# Patient Record
Sex: Male | Born: 1965 | Race: White | Hispanic: No | Marital: Married | Smoking: Never smoker
Health system: Southern US, Community
[De-identification: ages and names within clinical notes are randomized; demographics above are authoritative.]

## PROBLEM LIST (undated history)

## (undated) DIAGNOSIS — G4733 Obstructive sleep apnea (adult) (pediatric): Secondary | ICD-10-CM

## (undated) DIAGNOSIS — T7840XA Allergy, unspecified, initial encounter: Secondary | ICD-10-CM

## (undated) DIAGNOSIS — G4730 Sleep apnea, unspecified: Secondary | ICD-10-CM

## (undated) DIAGNOSIS — K219 Gastro-esophageal reflux disease without esophagitis: Secondary | ICD-10-CM

## (undated) DIAGNOSIS — E785 Hyperlipidemia, unspecified: Secondary | ICD-10-CM

## (undated) DIAGNOSIS — E66813 Obesity, class 3: Secondary | ICD-10-CM

## (undated) DIAGNOSIS — E039 Hypothyroidism, unspecified: Secondary | ICD-10-CM

## (undated) DIAGNOSIS — I10 Essential (primary) hypertension: Secondary | ICD-10-CM

## (undated) DIAGNOSIS — F329 Major depressive disorder, single episode, unspecified: Secondary | ICD-10-CM

## (undated) DIAGNOSIS — K5792 Diverticulitis of intestine, part unspecified, without perforation or abscess without bleeding: Secondary | ICD-10-CM

## (undated) DIAGNOSIS — F32A Depression, unspecified: Secondary | ICD-10-CM

## (undated) HISTORY — DX: Diverticulitis of intestine, part unspecified, without perforation or abscess without bleeding: K57.92

## (undated) HISTORY — DX: Allergy, unspecified, initial encounter: T78.40XA

## (undated) HISTORY — DX: Hyperlipidemia, unspecified: E78.5

## (undated) HISTORY — DX: Depression, unspecified: F32.A

## (undated) HISTORY — PX: POLYPECTOMY: SHX149

## (undated) HISTORY — DX: Hypothyroidism, unspecified: E03.9

## (undated) HISTORY — DX: Essential (primary) hypertension: I10

## (undated) HISTORY — DX: Major depressive disorder, single episode, unspecified: F32.9

## (undated) HISTORY — DX: Gastro-esophageal reflux disease without esophagitis: K21.9

## (undated) HISTORY — DX: Obstructive sleep apnea (adult) (pediatric): G47.33

## (undated) HISTORY — PX: COLONOSCOPY: SHX174

## (undated) HISTORY — DX: Sleep apnea, unspecified: G47.30

---

## 1997-09-07 ENCOUNTER — Encounter: Admission: RE | Admit: 1997-09-07 | Discharge: 1997-09-07 | Payer: Self-pay | Admitting: *Deleted

## 2000-01-22 HISTORY — PX: NASAL SEPTUM SURGERY: SHX37

## 2001-01-21 HISTORY — PX: UMBILICAL HERNIA REPAIR: SHX196

## 2003-11-30 ENCOUNTER — Ambulatory Visit: Payer: Self-pay | Admitting: Pulmonary Disease

## 2003-12-26 ENCOUNTER — Ambulatory Visit: Payer: Self-pay | Admitting: Pulmonary Disease

## 2003-12-26 ENCOUNTER — Ambulatory Visit (HOSPITAL_BASED_OUTPATIENT_CLINIC_OR_DEPARTMENT_OTHER): Admission: RE | Admit: 2003-12-26 | Discharge: 2003-12-26 | Payer: Self-pay | Admitting: Pulmonary Disease

## 2004-02-14 ENCOUNTER — Ambulatory Visit: Payer: Self-pay | Admitting: Pulmonary Disease

## 2004-03-13 ENCOUNTER — Ambulatory Visit: Payer: Self-pay | Admitting: Pulmonary Disease

## 2007-11-23 ENCOUNTER — Ambulatory Visit: Payer: Self-pay | Admitting: Pulmonary Disease

## 2007-11-23 DIAGNOSIS — E785 Hyperlipidemia, unspecified: Secondary | ICD-10-CM | POA: Insufficient documentation

## 2007-11-23 DIAGNOSIS — G4733 Obstructive sleep apnea (adult) (pediatric): Secondary | ICD-10-CM

## 2008-11-21 ENCOUNTER — Ambulatory Visit: Payer: Self-pay | Admitting: Pulmonary Disease

## 2009-05-03 ENCOUNTER — Ambulatory Visit: Payer: Self-pay | Admitting: Internal Medicine

## 2009-05-04 ENCOUNTER — Encounter: Payer: Self-pay | Admitting: Internal Medicine

## 2009-05-04 ENCOUNTER — Inpatient Hospital Stay (HOSPITAL_COMMUNITY): Admission: EM | Admit: 2009-05-04 | Discharge: 2009-05-06 | Payer: Self-pay | Admitting: Emergency Medicine

## 2009-05-04 ENCOUNTER — Encounter (INDEPENDENT_AMBULATORY_CARE_PROVIDER_SITE_OTHER): Payer: Self-pay

## 2009-11-20 ENCOUNTER — Ambulatory Visit: Payer: Self-pay | Admitting: Pulmonary Disease

## 2010-01-21 HISTORY — PX: CHOLECYSTECTOMY: SHX55

## 2010-02-20 NOTE — Assessment & Plan Note (Signed)
Summary: rov for osa   Visit Type:  Follow-up Primary Tashia Leiterman/Referring Jannat Rosemeyer:  Avva  CC:  1 year follow up. pt states uses cpap everynight x 7 hrs a night. Pt states he is having no problems at all. Marland Kitchen  History of Present Illness: the pt comes in today for f/u of his known osa.  He is wearing  cpap compliantly, and is satisfied with his sleep and daytime alertness.  He denies any issue with mask fit, but is due for a new mask.  His pressure is adequate, and he feels his machine is functioning properly.  His wife has not c/o breakthru snoring.  Current Medications (verified): 1)  Vytorin 10-40 Mg Tabs (Ezetimibe-Simvastatin) .... Take 1 Tablet By Mouth Once A Day 2)  Levoxyl 137 Mcg Tabs (Levothyroxine Sodium) .... Take 1 Tablet By Mouth Once A Day 3)  Sertraline Hcl 50 Mg Tabs (Sertraline Hcl) .... Take 1 Tablet By Mouth Once A Day  Allergies (verified): No Known Drug Allergies  Review of Systems       The patient complains of nasal congestion/difficulty breathing through nose.  The patient denies shortness of breath with activity, shortness of breath at rest, productive cough, non-productive cough, coughing up blood, chest pain, irregular heartbeats, acid heartburn, indigestion, loss of appetite, weight change, abdominal pain, difficulty swallowing, sore throat, tooth/dental problems, headaches, sneezing, itching, ear ache, anxiety, depression, hand/feet swelling, joint stiffness or pain, rash, change in color of mucus, and fever.    Vital Signs:  Patient profile:   45 year old male Height:      71 inches Weight:      271 pounds BMI:     37.93 O2 Sat:      98 % on Room air Temp:     98.4 degrees F oral Pulse rate:   60 / minute BP sitting:   110 / 72  (left arm) Cuff size:   large  Vitals Entered By: Carver Fila (November 20, 2009 9:04 AM)  O2 Flow:  Room air CC: 1 year follow up. pt states uses cpap everynight x 7 hrs a night. Pt states he is having no problems at all.    Comments meds and allergies updated Phone number updated Carver Fila  November 20, 2009 9:05 AM    Physical Exam  General:  ow male in nad Nose:  no skin breakdown or pressure necrosis from cpap mask Extremities:  no edema or cyanosis noted. Neurologic:  alert and oriented, moves all 4.   Impression & Recommendations:  Problem # 1:  OBSTRUCTIVE SLEEP APNEA (ICD-327.23)  the pt is doing well with cpap, and feels that his sleep and daytime alertness are adequate.  He is due for a replacement mask and supplies, and he will contact dme for this.  I have encouraged him to work on weight loss, and to follow up with me in one year.    Other Orders: Est. Patient Level III (98119)  Patient Instructions: 1)  work on weight loss 2)  call dme about getting new mask and supplies 3)  if doing well, followup with me in one year.   Immunization History:  Influenza Immunization History:    Influenza:  historical (08/21/2009)

## 2010-04-10 LAB — COMPREHENSIVE METABOLIC PANEL
ALT: 106 U/L — ABNORMAL HIGH (ref 0–53)
ALT: 66 U/L — ABNORMAL HIGH (ref 0–53)
AST: 31 U/L (ref 0–37)
AST: 72 U/L — ABNORMAL HIGH (ref 0–37)
Alkaline Phosphatase: 84 U/L (ref 39–117)
Alkaline Phosphatase: 94 U/L (ref 39–117)
BUN: 6 mg/dL (ref 6–23)
BUN: 7 mg/dL (ref 6–23)
CO2: 27 mEq/L (ref 19–32)
Calcium: 8.1 mg/dL — ABNORMAL LOW (ref 8.4–10.5)
Calcium: 8.2 mg/dL — ABNORMAL LOW (ref 8.4–10.5)
Chloride: 106 mEq/L (ref 96–112)
Creatinine, Ser: 1.14 mg/dL (ref 0.4–1.5)
Creatinine, Ser: 1.17 mg/dL (ref 0.4–1.5)
GFR calc non Af Amer: >60 mL/min (ref >60)
Glucose, Bld: 102 mg/dL — ABNORMAL HIGH (ref 70–99)
Glucose, Bld: 86 mg/dL (ref 70–99)
Potassium: 3.2 mEq/L — ABNORMAL LOW (ref 3.5–5.1)
Sodium: 136 mEq/L (ref 135–145)
Total Protein: 6.1 g/dL (ref 6.0–8.3)

## 2010-04-10 LAB — CBC
HCT: 34.7 % — ABNORMAL LOW (ref 39.0–52.0)
MCHC: 34.6 g/dL (ref 30.0–36.0)
MCHC: 34.6 g/dL (ref 30.0–36.0)
Platelets: 203 10*3/uL (ref 150–400)
RBC: 3.78 MIL/uL — ABNORMAL LOW (ref 4.22–5.81)
RDW: 13.3 % (ref 11.5–15.5)
WBC: 8.6 10*3/uL (ref 4.0–10.5)

## 2010-04-11 LAB — COMPREHENSIVE METABOLIC PANEL
ALT: 22 U/L (ref 0–53)
ALT: 38 U/L (ref 0–53)
AST: 22 U/L (ref 0–37)
Albumin: 4.3 g/dL (ref 3.5–5.2)
Alkaline Phosphatase: 83 U/L (ref 39–117)
Alkaline Phosphatase: 93 U/L (ref 39–117)
BUN: 9 mg/dL (ref 6–23)
CO2: 26 mEq/L (ref 19–32)
Calcium: 9.2 mg/dL (ref 8.4–10.5)
Chloride: 106 mEq/L (ref 96–112)
GFR calc non Af Amer: >60 mL/min (ref >60)
GFR calc non Af Amer: >60 mL/min (ref >60)
Glucose, Bld: 136 mg/dL — ABNORMAL HIGH (ref 70–99)
Glucose, Bld: 156 mg/dL — ABNORMAL HIGH (ref 70–99)
Potassium: 3.4 mEq/L — ABNORMAL LOW (ref 3.5–5.1)
Potassium: 3.9 mEq/L (ref 3.5–5.1)
Sodium: 131 mEq/L — ABNORMAL LOW (ref 135–145)
Total Bilirubin: 0.4 mg/dL (ref 0.3–1.2)
Total Protein: 6.9 g/dL (ref 6.0–8.3)
Total Protein: 7.6 g/dL (ref 6.0–8.3)

## 2010-04-11 LAB — URINALYSIS, ROUTINE W REFLEX MICROSCOPIC
Bilirubin Urine: NEGATIVE
Glucose, UA: NEGATIVE mg/dL
Ketones, ur: NEGATIVE mg/dL
Leukocytes, UA: NEGATIVE
Nitrite: NEGATIVE
Protein, ur: NEGATIVE mg/dL
Urobilinogen, UA: 0.2 mg/dL (ref 0.0–1.0)

## 2010-04-11 LAB — CULTURE, BLOOD (ROUTINE X 2)
Culture: NO GROWTH
Culture: NO GROWTH

## 2010-04-11 LAB — CBC
HCT: 39.8 % (ref 39.0–52.0)
HCT: 40.9 % (ref 39.0–52.0)
Hemoglobin: 13.6 g/dL (ref 13.0–17.0)
MCHC: 34.1 g/dL (ref 30.0–36.0)
RBC: 4.36 MIL/uL (ref 4.22–5.81)
RDW: 13.3 % (ref 11.5–15.5)
RDW: 13.7 % (ref 11.5–15.5)
WBC: 11.5 10*3/uL — ABNORMAL HIGH (ref 4.0–10.5)

## 2010-04-11 LAB — TROPONIN I: Troponin I: <0.01 ng/mL (ref 0.00–0.06)

## 2010-04-11 LAB — LIPASE, BLOOD: Lipase: 23 U/L (ref 11–59)

## 2010-04-11 LAB — URINE MICROSCOPIC-ADD ON

## 2010-04-11 LAB — DIFFERENTIAL
Basophils Absolute: 0 10*3/uL (ref 0.0–0.1)
Lymphocytes Relative: 11 % — ABNORMAL LOW (ref 12–46)
Lymphs Abs: 1.2 10*3/uL (ref 0.7–4.0)
Monocytes Relative: 4 % (ref 3–12)

## 2010-04-11 LAB — CARDIAC PANEL(CRET KIN+CKTOT+MB+TROPI)
CK, MB: 0.6 ng/mL (ref 0.3–4.0)
Total CK: 68 U/L (ref 7–232)

## 2010-04-11 LAB — CK TOTAL AND CKMB (NOT AT ARMC)
CK, MB: 1.2 ng/mL (ref 0.3–4.0)
Relative Index: INVALID (ref 0.0–2.5)

## 2010-04-11 LAB — TSH: TSH: 1.227 u[IU]/mL (ref 0.350–4.500)

## 2010-04-11 LAB — POCT CARDIAC MARKERS
Myoglobin, poc: 47.1 ng/mL (ref 12–200)
Troponin i, poc: <0.05 ng/mL (ref 0.00–0.09)

## 2010-04-11 LAB — MRSA PCR SCREENING: MRSA by PCR: NEGATIVE

## 2010-06-08 NOTE — Procedures (Signed)
Joshua Mcdonald, WITTY NO.:  [id number]   MEDICAL RECORD NO.:  [id number]          PATIENT TYPE:  OUT   LOCATION:  SLEEP CENTER                 FACILITY:  [facility]   PHYSICIAN:  Marcelyn Bruins, M.D. [facility] DATE OF BIRTH:  1965-08-26   DATE OF STUDY:  12/26/2003                              NOCTURNAL POLYSOMNOGRAM   REFERRING PHYSICIAN:  Dr. Marcelyn Bruins.   INDICATION FOR THE STUDY:  Hypersomnia with sleep apnea.  Epworth score is  8.   SLEEP ARCHITECTURE:  The patient had a total sleep time of 370 minutes with  a sleep efficiency of 90%.  There was decreased slow wave sleep and REM.  Sleep latency and REM latency were normal.   IMPRESSION:  1.  Severe obstructive sleep apnea/hypopnea syndrome with a respiratory      disturbance index of 45 events per hour and oxygen desaturation as low      as 68%.  Events were not positional.  2.  Very loud snoring noted throughout the study.  3.  Significant cardiac accelerations and decelerations associated with his      obstructive events.      KC/MEDQ  D:  01/06/2004 11:22:03  T:  01/07/2004 09:12:47  Job:  [id number]

## 2010-11-16 ENCOUNTER — Encounter: Payer: Self-pay | Admitting: Pulmonary Disease

## 2010-11-19 ENCOUNTER — Ambulatory Visit: Payer: Self-pay | Admitting: Pulmonary Disease

## 2012-10-26 ENCOUNTER — Ambulatory Visit (INDEPENDENT_AMBULATORY_CARE_PROVIDER_SITE_OTHER): Payer: BC Managed Care – PPO | Admitting: Pulmonary Disease

## 2012-10-26 ENCOUNTER — Encounter: Payer: Self-pay | Admitting: Pulmonary Disease

## 2012-10-26 VITALS — BP 138/90 | HR 68 | Temp 98.0°F | Ht 70.0 in | Wt 265.6 lb

## 2012-10-26 DIAGNOSIS — G4733 Obstructive sleep apnea (adult) (pediatric): Secondary | ICD-10-CM

## 2012-10-26 DIAGNOSIS — Z23 Encounter for immunization: Secondary | ICD-10-CM

## 2012-10-26 NOTE — Progress Notes (Signed)
  Subjective:    Patient ID: Joshua Mcdonald, male    DOB: July 12, 1965, 47 y.o.   MRN: [id number]  HPI Patient comes in today for followup of his obstructive sleep apnea.  I have not seen him since 2011, but he is been using his CPAP religiously.  He feels that he sleeps fairly well with this device, but he is starting to hear unusual noises and whining coming from his machine.  He has severe regional device from 2005, and it is clearly overdue for replacement.  The patient is satisfied with his daytime alertness.  Of note, his weight is down 6 pounds from the last visit with me.   Review of Systems  Constitutional: Negative for fever and unexpected weight change.  HENT: Negative for ear pain, nosebleeds, congestion, sore throat, rhinorrhea, sneezing, trouble swallowing, dental problem, postnasal drip and sinus pressure.   Eyes: Negative for redness and itching.  Respiratory: Negative for cough, chest tightness, shortness of breath and wheezing.   Cardiovascular: Negative for palpitations and leg swelling.  Gastrointestinal: Negative for nausea and vomiting.  Genitourinary: Negative for dysuria.  Musculoskeletal: Negative for joint swelling.  Skin: Negative for rash.  Neurological: Negative for headaches.  Hematological: Does not bruise/bleed easily.  Psychiatric/Behavioral: Negative for dysphoric mood. The patient is not nervous/anxious.        Objective:   Physical Exam Overweight male in no acute distress Nose without purulent discharge noted No skin breakdown or pressure necrosis from the CPAP mask Neck without lymphadenopathy or thyromegaly Lower extremities without edema, cyanosis Alert and oriented, moves all 4 extremities.       Assessment & Plan:

## 2012-10-26 NOTE — Patient Instructions (Addendum)
Continue with cpap, and we will send an order to get you a new device. Work on weight loss followup with me in one year if doing well.

## 2012-10-26 NOTE — Assessment & Plan Note (Signed)
The patient is doing fairly well with CPAP, but is overdue for a replacement device.  I will arrange for him to get a new machine, and I've asked him to keep up with his supplies and mask changes.  I have also encouraged him to work aggressively on weight loss.

## 2013-10-26 ENCOUNTER — Ambulatory Visit (INDEPENDENT_AMBULATORY_CARE_PROVIDER_SITE_OTHER): Payer: BC Managed Care – PPO | Admitting: Pulmonary Disease

## 2013-10-26 ENCOUNTER — Encounter: Payer: Self-pay | Admitting: Pulmonary Disease

## 2013-10-26 VITALS — BP 142/78 | HR 66 | Temp 98.1°F | Ht 70.0 in | Wt 288.8 lb

## 2013-10-26 DIAGNOSIS — G4733 Obstructive sleep apnea (adult) (pediatric): Secondary | ICD-10-CM

## 2013-10-26 NOTE — Progress Notes (Signed)
   Subjective:    Patient ID: Joshua Mcdonald, male    DOB: [birth year], 48 y.o.   MRN: [id number]  HPI Patient comes in today for followup of his obstructive sleep apnea.  He is wearing CPAP compliantly, and is having no issues with his mask fit or pressure. His download shows excellent compliance, and no significant mask leak. He has good control of his AHI. The patient feels that he is sleeping reasonably well other than issues with his young child at night. It should be noted he has gained over 20 pounds since the last visit.   Review of Systems  Constitutional: Negative for fever and unexpected weight change.  HENT: Negative for congestion, dental problem, ear pain, nosebleeds, postnasal drip, rhinorrhea, sinus pressure, sneezing, sore throat and trouble swallowing.   Eyes: Negative for redness and itching.  Respiratory: Negative for cough, chest tightness, shortness of breath and wheezing.   Cardiovascular: Negative for palpitations and leg swelling.  Gastrointestinal: Negative for nausea and vomiting.  Genitourinary: Negative for dysuria.  Musculoskeletal: Negative for joint swelling.  Skin: Negative for rash.  Neurological: Negative for headaches.  Hematological: Does not bruise/bleed easily.  Psychiatric/Behavioral: Negative for dysphoric mood. The patient is not nervous/anxious.        Objective:   Physical Exam Obese male in no acute distress Nose without purulence or discharge noted, no skin breakdown or pressure necrosis from the CPAP mask Neck without lymphadenopathy or thyromegaly Lower extremities without edema, no cyanosis Alert and oriented, moves all 4 extremities.       Assessment & Plan:

## 2013-10-26 NOTE — Patient Instructions (Signed)
Continue on cpap, and keep up with mask changes and supplies. Work on weight loss followup with me again in one year.  

## 2013-10-26 NOTE — Assessment & Plan Note (Signed)
The patient is doing very well on CPAP, and his download shows good control of his apnea with excellent compliance. I have asked him to continue on this, keep up with mask changes and supplies, and to work aggressively on weight loss.

## 2014-05-13 ENCOUNTER — Inpatient Hospital Stay (HOSPITAL_COMMUNITY)
Admission: EM | Admit: 2014-05-13 | Discharge: 2014-05-16 | DRG: 392 | Disposition: A | Discharge status: Home / Self Care | Payer: BLUE CROSS/BLUE SHIELD | Attending: Surgery | Admitting: Surgery

## 2014-05-13 ENCOUNTER — Emergency Department (HOSPITAL_COMMUNITY): Payer: BLUE CROSS/BLUE SHIELD

## 2014-05-13 ENCOUNTER — Encounter (HOSPITAL_COMMUNITY): Payer: Self-pay | Admitting: Physical Medicine and Rehabilitation

## 2014-05-13 DIAGNOSIS — E669 Obesity, unspecified: Secondary | ICD-10-CM | POA: Diagnosis present

## 2014-05-13 DIAGNOSIS — G4733 Obstructive sleep apnea (adult) (pediatric): Secondary | ICD-10-CM | POA: Diagnosis present

## 2014-05-13 DIAGNOSIS — Z6841 Body Mass Index (BMI) 40.0 and over, adult: Secondary | ICD-10-CM

## 2014-05-13 DIAGNOSIS — E785 Hyperlipidemia, unspecified: Secondary | ICD-10-CM | POA: Diagnosis present

## 2014-05-13 DIAGNOSIS — K5780 Diverticulitis of intestine, part unspecified, with perforation and abscess without bleeding: Secondary | ICD-10-CM | POA: Diagnosis present

## 2014-05-13 DIAGNOSIS — K5720 Diverticulitis of large intestine with perforation and abscess without bleeding: Principal | ICD-10-CM | POA: Diagnosis present

## 2014-05-13 HISTORY — DX: Obesity, class 3: E66.813

## 2014-05-13 HISTORY — DX: Morbid (severe) obesity due to excess calories: E66.01

## 2014-05-13 LAB — COMPREHENSIVE METABOLIC PANEL
ALBUMIN: 3.5 g/dL (ref 3.5–5.2)
ALK PHOS: 128 U/L — AB (ref 39–117)
ALT: 38 U/L (ref 0–53)
ANION GAP: 11 (ref 5–15)
AST: 28 U/L (ref 0–37)
BILIRUBIN TOTAL: 1 mg/dL (ref 0.3–1.2)
BUN: 10 mg/dL (ref 6–23)
CO2: 25 mmol/L (ref 19–32)
Calcium: 9 mg/dL (ref 8.4–10.5)
Chloride: 103 mmol/L (ref 96–112)
Creatinine, Ser: 1.15 mg/dL (ref 0.50–1.35)
GFR calc Af Amer: 84 mL/min — ABNORMAL LOW (ref >90)
GFR calc non Af Amer: 73 mL/min — ABNORMAL LOW (ref >90)
Glucose, Bld: 105 mg/dL — ABNORMAL HIGH (ref 70–99)
Potassium: 3.5 mmol/L (ref 3.5–5.1)
SODIUM: 139 mmol/L (ref 135–145)
TOTAL PROTEIN: 7.5 g/dL (ref 6.0–8.3)

## 2014-05-13 LAB — URINALYSIS, ROUTINE W REFLEX MICROSCOPIC
Glucose, UA: NEGATIVE mg/dL
KETONES UR: 15 mg/dL — AB
Leukocytes, UA: NEGATIVE
Nitrite: NEGATIVE
PROTEIN: 100 mg/dL — AB
SPECIFIC GRAVITY, URINE: 1.028 (ref 1.005–1.030)
Urobilinogen, UA: 1 mg/dL (ref 0.0–1.0)
pH: 5.5 (ref 5.0–8.0)

## 2014-05-13 LAB — CBC WITH DIFFERENTIAL/PLATELET
Basophils Absolute: 0 10*3/uL (ref 0.0–0.1)
Basophils Relative: 0 % (ref 0–1)
EOS PCT: 0 % (ref 0–5)
Eosinophils Absolute: 0 10*3/uL (ref 0.0–0.7)
HEMATOCRIT: 42.2 % (ref 39.0–52.0)
Hemoglobin: 14.1 g/dL (ref 13.0–17.0)
LYMPHS ABS: 1.4 10*3/uL (ref 0.7–4.0)
LYMPHS PCT: 10 % — AB (ref 12–46)
MCH: 29.6 pg (ref 26.0–34.0)
MCHC: 33.4 g/dL (ref 30.0–36.0)
MCV: 88.5 fL (ref 78.0–100.0)
MONO ABS: 1.2 10*3/uL — AB (ref 0.1–1.0)
Monocytes Relative: 9 % (ref 3–12)
NEUTROS ABS: 10.9 10*3/uL — AB (ref 1.7–7.7)
Neutrophils Relative %: 81 % — ABNORMAL HIGH (ref 43–77)
PLATELETS: 291 10*3/uL (ref 150–400)
RBC: 4.77 MIL/uL (ref 4.22–5.81)
RDW: 14.1 % (ref 11.5–15.5)
WBC: 13.5 10*3/uL — ABNORMAL HIGH (ref 4.0–10.5)

## 2014-05-13 LAB — URINE MICROSCOPIC-ADD ON

## 2014-05-13 LAB — I-STAT CREATININE, ED: CREATININE: 1.1 mg/dL (ref 0.50–1.35)

## 2014-05-13 LAB — I-STAT CG4 LACTIC ACID, ED: LACTIC ACID, VENOUS: 0.79 mmol/L (ref 0.5–2.0)

## 2014-05-13 LAB — LIPASE, BLOOD: Lipase: 19 U/L (ref 11–59)

## 2014-05-13 MED ORDER — METRONIDAZOLE IN NACL 5-0.79 MG/ML-% IV SOLN
500.0000 mg | Freq: Once | INTRAVENOUS | Status: AC
  Administered 2014-05-13: 500 mg via INTRAVENOUS
  Filled 2014-05-13: qty 100

## 2014-05-13 MED ORDER — ONDANSETRON HCL 4 MG/2ML IJ SOLN: 4.0000 mg | Freq: Once | INTRAMUSCULAR | Status: DC

## 2014-05-13 MED ORDER — IOHEXOL 300 MG/ML  SOLN
100.0000 mL | Freq: Once | INTRAMUSCULAR | Status: AC | PRN
  Administered 2014-05-13: 100 mL via INTRAVENOUS

## 2014-05-13 MED ORDER — HYDROMORPHONE HCL 1 MG/ML IJ SOLN
1.0000 mg | INTRAMUSCULAR | Status: DC | PRN
Start: 2014-05-13 — End: 2014-05-15

## 2014-05-13 MED ORDER — PANTOPRAZOLE SODIUM 40 MG IV SOLR
40.0000 mg | Freq: Every day | INTRAVENOUS | Status: DC
  Administered 2014-05-13 – 2014-05-14 (×2): 40 mg via INTRAVENOUS
  Filled 2014-05-13 (×2): qty 40

## 2014-05-13 MED ORDER — PIPERACILLIN-TAZOBACTAM 3.375 G IVPB
3.3750 g | Freq: Three times a day (TID) | INTRAVENOUS | Status: DC
  Administered 2014-05-14 – 2014-05-15 (×4): 3.375 g via INTRAVENOUS
  Filled 2014-05-13 (×5): qty 50

## 2014-05-13 MED ORDER — MORPHINE SULFATE 4 MG/ML IJ SOLN
6.0000 mg | Freq: Once | INTRAMUSCULAR | Status: AC
  Administered 2014-05-13: 6 mg via INTRAVENOUS
  Filled 2014-05-13: qty 2

## 2014-05-13 MED ORDER — ACETAMINOPHEN 500 MG PO TABS
1000.0000 mg | ORAL_TABLET | Freq: Once | ORAL | Status: AC
  Administered 2014-05-13: 1000 mg via ORAL
  Filled 2014-05-13: qty 2

## 2014-05-13 MED ORDER — POTASSIUM CHLORIDE IN NACL 20-0.9 MEQ/L-% IV SOLN
INTRAVENOUS | Status: DC
  Administered 2014-05-13 – 2014-05-15 (×2): via INTRAVENOUS
  Filled 2014-05-13 (×5): qty 1000

## 2014-05-13 MED ORDER — SODIUM CHLORIDE 0.9 % IV BOLUS (SEPSIS)
1000.0000 mL | Freq: Once | INTRAVENOUS | Status: AC
  Administered 2014-05-13: 1000 mL via INTRAVENOUS

## 2014-05-13 MED ORDER — FENTANYL CITRATE (PF) 100 MCG/2ML IJ SOLN
50.0000 ug | Freq: Once | INTRAMUSCULAR | Status: AC
  Administered 2014-05-13: 50 ug via INTRAVENOUS
  Filled 2014-05-13: qty 2

## 2014-05-13 MED ORDER — ENOXAPARIN SODIUM 60 MG/0.6ML ~~LOC~~ SOLN
60.0000 mg | SUBCUTANEOUS | Status: DC
  Administered 2014-05-14 – 2014-05-15 (×2): 60 mg via SUBCUTANEOUS
  Filled 2014-05-13 (×3): qty 0.6

## 2014-05-13 MED ORDER — ONDANSETRON HCL 4 MG/2ML IJ SOLN
4.0000 mg | Freq: Four times a day (QID) | INTRAMUSCULAR | Status: DC | PRN
Start: 2014-05-13 — End: 2014-05-16

## 2014-05-13 MED ORDER — CIPROFLOXACIN IN D5W 400 MG/200ML IV SOLN
400.0000 mg | Freq: Once | INTRAVENOUS | Status: AC
  Administered 2014-05-13: 400 mg via INTRAVENOUS
  Filled 2014-05-13: qty 200

## 2014-05-13 MED ORDER — PIPERACILLIN-TAZOBACTAM 3.375 G IVPB 30 MIN
3.3750 g | Freq: Once | INTRAVENOUS | Status: AC
  Administered 2014-05-13: 3.375 g via INTRAVENOUS
  Filled 2014-05-13: qty 50

## 2014-05-13 MED ORDER — IOHEXOL 300 MG/ML  SOLN
25.0000 mL | Freq: Once | INTRAMUSCULAR | Status: AC | PRN
  Administered 2014-05-13: 25 mL via ORAL

## 2014-05-13 NOTE — Progress Notes (Signed)
Pt admitted from ED with c/o of abdominal pain,pt is pain free at this time,settled in bed, call light within pt's reach, will however continue to monitor. Obasogie-Asidi, Marka Treloar Efe

## 2014-05-13 NOTE — ED Notes (Signed)
Dr. Blackman at bedside. 

## 2014-05-13 NOTE — ED Provider Notes (Signed)
CSN: [id number]     Arrival date & time 05/13/14  1247 History   First MD Initiated Contact with Patient 05/13/14 1404     Chief Complaint  Patient presents with  . Abdominal Pain  . Fever  . Headache     (Consider location/radiation/quality/duration/timing/severity/associated sxs/prior Treatment) HPI   49 year old male with history of hyperlipidemia and history of obstructive sleep apnea who was sent here from primary care office for evaluation of abdominal pain. Patient report right lower quadrant abdominal tenderness ongoing for the past 3 days. He described pain as a crampy sensation, 8 out of 10, persistent, nothing seems to make it better or worse. Endorse a fever of 101.5 morning,  chills, sweats, myalgias. He also reported having 7-8 separate episodes of mucousy stools for the past several days. Endorse having headache. He has decreased appetite. He has tried taking ibuprofen with minimal relief. Patient denies cold symptoms, chest pain, shortness of breath, productive cough.  He was seen at his PCP office today for his complaint. He was found to have elevated WBC and was sent here for further evaluation of his abdominal pain. Patient also has a umbilical hernia from prior abdominal surgery but denies any significant pain at the hernia site. Prior surgical history of cholecystectomy but still has an intact appendix. Furthermore patient denies any recent antibiotic use, or recent travel. Denies any sick contact. His pain now as a 6 out of 10.    Past Medical History  Diagnosis Date  . OSA (obstructive sleep apnea)   . Hyperlipidemia    History reviewed. No pertinent past surgical history. Family History  Problem Relation Age of Onset  . Heart disease Maternal Grandmother   . Cancer Maternal Grandmother     unsure what type  . Lung cancer Maternal Grandfather   . Breast cancer Paternal Grandmother   . Liver cancer Paternal Grandfather    History  Substance Use Topics  .  Smoking status: Never Smoker   . Smokeless tobacco: Not on file  . Alcohol Use: No    Review of Systems  All other systems reviewed and are negative.     Allergies  Review of patient's allergies indicates no known allergies.  Home Medications   Prior to Admission medications   Medication Sig Start Date End Date Taking? Authorizing Provider  ezetimibe-simvastatin (VYTORIN) 10-40 MG per tablet Take 1 tablet by mouth at bedtime.      Historical Provider, MD  levothyroxine (SYNTHROID, LEVOTHROID) 175 MCG tablet Take 175 mcg by mouth daily before breakfast.    Historical Provider, MD  sertraline (ZOLOFT) 50 MG tablet Take 50 mg by mouth daily.      Historical Provider, MD   BP 151/85 mmHg  Pulse 96  Temp(Src) 99.2 F (37.3 C) (Oral)  Resp 18  SpO2 98% Physical Exam  Constitutional: He appears well-developed and well-nourished. No distress.  HENT:  Head: Atraumatic.  Eyes: Conjunctivae are normal.  Neck: Normal range of motion. Neck supple.  Cardiovascular: Normal rate and regular rhythm.   Pulmonary/Chest: Effort normal and breath sounds normal.  Abdominal: Soft. There is tenderness (Diffuse abdominal tenderness most significant to right lower quadrant with guarding but without rebound tenderness. Umbilical surgical scar without any obvious evidence of incarcerated hernia.). There is guarding. There is no rebound.  Decreased bowel sounds.  Genitourinary:  Chaperone present during exam: Normal penile shaft, circumcised. Left testicle mildly tender to palpation without any obvious mass or scrotal swelling. Testicle with normal lie. Testicle is  nontender to palpation. No inguinal hernia noted.  Neurological: He is alert.  Skin: No rash noted.  Psychiatric: He has a normal mood and affect.  Nursing note and vitals reviewed.   ED Course  Procedures (including critical care time)  Patient here with fever, chills, abdominal pain, decrease in appetite, and having diarrhea with  mucousy stool. Plan to obtain abdominal pelvic CT scan to rule out appendicitis or colitis causing his symptoms. Pain medication given. Will closely monitor.  4:13 PM Care discussed with oncoming provider who will determine disposition pending CT scan.  Pt currently resting comfortably and mentioned that his pain is well controlled.    Labs Review Labs Reviewed  CBC WITH DIFFERENTIAL/PLATELET - Abnormal; Notable for the following:    WBC 13.5 (*)    Neutrophils Relative % 81 (*)    Neutro Abs 10.9 (*)    Lymphocytes Relative 10 (*)    Monocytes Absolute 1.2 (*)    All other components within normal limits  COMPREHENSIVE METABOLIC PANEL - Abnormal; Notable for the following:    Glucose, Bld 105 (*)    Alkaline Phosphatase 128 (*)    GFR calc non Af Amer 73 (*)    GFR calc Af Amer 84 (*)    All other components within normal limits  LIPASE, BLOOD  URINALYSIS, ROUTINE W REFLEX MICROSCOPIC  I-STAT CREATININE, ED    Imaging Review No results found.   EKG Interpretation None      MDM   Final diagnoses:  None    BP 128/63 mmHg  Pulse 99  Temp(Src) 99.2 F (37.3 C) (Oral)  Resp 18  SpO2 99%     Domenic Moras, PA-C 05/13/14 [place], MD 05/14/14 [phone]

## 2014-05-13 NOTE — ED Notes (Signed)
Pt presents to department for evaluation of RLQ, fever, nausea and headache. 8/10 RLQ abdominal pain. Elevated WBC from PCP office. Pt is alert and oriented x4.

## 2014-05-13 NOTE — ED Provider Notes (Signed)
I received this patient in sign out from Domenic Moras, [place].  Briefly, this is a 49 year old male, with a history of hyperlipidemia, OSA, presenting today from primary care physician's office for evaluation of abdominal pain. Onset 3 days ago, located lower abdomen, worse in the right lower quadrant. Persistent, severe, crampy. Associated with fever, chills, diarrhea. Positive for decreased appetite.  Abdominal exam is without peritoneal signs.  Labs reveal leukocytosis. Negative for other acute abnormalities. Plan for now is to follow-up CT scan of the abdomen, rule out surgical etiology of his presentation. The patient remained stable, CT scan is within normal limits, we'll likely discharge.  CT scan reveals sigmoid colonic diverticulitis, with correlating 3 cm abscess. Antibiotics have been administered. Patient is nothing by mouth. He remains stable. Surgery has been consult. They will admit to their service. Patient is being transported in stable condition.  I have discussed case and care has been guided by my attending physician, Dr. Vanita Panda.  Joshua Hutching, MD 05/14/14 [id number]  Joshua Muskrat, MD 05/15/14 [phone]

## 2014-05-13 NOTE — ED Notes (Signed)
Pt back from CT at this time. Hooked back up to monitor.

## 2014-05-13 NOTE — H&P (Signed)
Joshua Mcdonald is an 49 y.o. male.   Chief Complaint: Abdominal pain HPI: This gentleman presents with a 2 day history of lower abdominal pain. He reports that it came on suddenly Wednesday night. He describes it as a sharp cramping pain. It moves to the right lower quadrant. He has had mucousy bowel movements. He has had nausea but no vomiting. He denies fevers. He has no previous history of similar abdominal pain. He is otherwise without complaints.  Past Medical History  Diagnosis Date  . OSA (obstructive sleep apnea)   . Hyperlipidemia     History reviewed. No pertinent past surgical history.  Family History  Problem Relation Age of Onset  . Heart disease Maternal Grandmother   . Cancer Maternal Grandmother     unsure what type  . Lung cancer Maternal Grandfather   . Breast cancer Paternal Grandmother   . Liver cancer Paternal Grandfather    Social History:  reports that he has never smoked. He does not have any smokeless tobacco history on file. He reports that he does not drink alcohol or use illicit drugs.  Allergies: No Known Allergies   (Not in a hospital admission)  Results for orders placed or performed during the hospital encounter of 05/13/14 (from the past 48 hour(s))  CBC with Differential     Status: Abnormal   Collection Time: 05/13/14  1:34 PM  Result Value Ref Range   WBC 13.5 (H) 4.0 - 10.5 K/uL   RBC 4.77 4.22 - 5.81 MIL/uL   Hemoglobin 14.1 13.0 - 17.0 g/dL   HCT 42.2 39.0 - 52.0 %   MCV 88.5 78.0 - 100.0 fL   MCH 29.6 26.0 - 34.0 pg   MCHC 33.4 30.0 - 36.0 g/dL   RDW 14.1 11.5 - 15.5 %   Platelets 291 150 - 400 K/uL   Neutrophils Relative % 81 (H) 43 - 77 %   Neutro Abs 10.9 (H) 1.7 - 7.7 K/uL   Lymphocytes Relative 10 (L) 12 - 46 %   Lymphs Abs 1.4 0.7 - 4.0 K/uL   Monocytes Relative 9 3 - 12 %   Monocytes Absolute 1.2 (H) 0.1 - 1.0 K/uL   Eosinophils Relative 0 0 - 5 %   Eosinophils Absolute 0.0 0.0 - 0.7 K/uL   Basophils Relative 0 0 - 1 %    Basophils Absolute 0.0 0.0 - 0.1 K/uL  Comprehensive metabolic panel     Status: Abnormal   Collection Time: 05/13/14  1:34 PM  Result Value Ref Range   Sodium 139 135 - 145 mmol/L   Potassium 3.5 3.5 - 5.1 mmol/L   Chloride 103 96 - 112 mmol/L   CO2 25 19 - 32 mmol/L   Glucose, Bld 105 (H) 70 - 99 mg/dL   BUN 10 6 - 23 mg/dL   Creatinine, Ser 1.15 0.50 - 1.35 mg/dL   Calcium 9.0 8.4 - 10.5 mg/dL   Total Protein 7.5 6.0 - 8.3 g/dL   Albumin 3.5 3.5 - 5.2 g/dL   AST 28 0 - 37 U/L   ALT 38 0 - 53 U/L   Alkaline Phosphatase 128 (H) 39 - 117 U/L   Total Bilirubin 1.0 0.3 - 1.2 mg/dL   GFR calc non Af Amer 73 (L) >90 mL/min   GFR calc Af Amer 84 (L) >90 mL/min    Comment: (NOTE) The eGFR has been calculated using the CKD EPI equation. This calculation has not been validated in all clinical situations.  eGFR's persistently <90 mL/min signify possible Chronic Kidney Disease.    Anion gap 11 5 - 15  Lipase, blood     Status: None   Collection Time: 05/13/14  1:34 PM  Result Value Ref Range   Lipase 19 11 - 59 U/L  I-Stat Creatinine, ED (do not order at [facility])     Status: None   Collection Time: 05/13/14  2:45 PM  Result Value Ref Range   Creatinine, Ser 1.10 0.50 - 1.35 mg/dL  Urinalysis, Routine w reflex microscopic     Status: Abnormal   Collection Time: 05/13/14  3:20 PM  Result Value Ref Range   Color, Urine ORANGE (A) YELLOW    Comment: BIOCHEMICALS MAY BE AFFECTED BY COLOR   APPearance CLEAR CLEAR   Specific Gravity, Urine 1.028 1.005 - 1.030   pH 5.5 5.0 - 8.0   Glucose, UA NEGATIVE NEGATIVE mg/dL   Hgb urine dipstick MODERATE (A) NEGATIVE   Bilirubin Urine SMALL (A) NEGATIVE   Ketones, ur 15 (A) NEGATIVE mg/dL   Protein, ur 100 (A) NEGATIVE mg/dL   Urobilinogen, UA 1.0 0.0 - 1.0 mg/dL   Nitrite NEGATIVE NEGATIVE   Leukocytes, UA NEGATIVE NEGATIVE  Urine microscopic-add on     Status: Abnormal   Collection Time: 05/13/14  3:20 PM  Result Value Ref Range   Squamous  Epithelial / LPF RARE RARE   WBC, UA 0-2 <3 WBC/hpf   RBC / HPF 0-2 <3 RBC/hpf   Bacteria, UA FEW (A) RARE   Urine-Other MUCOUS PRESENT   I-Stat CG4 Lactic Acid, ED     Status: None   Collection Time: 05/13/14  7:02 PM  Result Value Ref Range   Lactic Acid, Venous 0.79 0.5 - 2.0 mmol/L   Ct Abdomen Pelvis W Contrast  05/13/2014   CLINICAL DATA:  Acute onset of right lower quadrant abdominal pain (8/10), fever, nausea, leukocytosis and headache. Surgical history includes cholecystectomy.  EXAM: CT ABDOMEN AND PELVIS WITH CONTRAST  TECHNIQUE: Multidetector CT imaging of the abdomen and pelvis was performed using the standard protocol following bolus administration of intravenous contrast.  CONTRAST:  173mL OMNIPAQUE IOHEXOL 300 MG/ML IV. Oral contrast was also administered.  COMPARISON:  05/04/2009.  FINDINGS: Liver: Mild diffuse hepatic steatosis without focal parenchymal abnormality.  Biliary: Surgically absent gallbladder. No unexpected biliary ductal dilation.  Spleen:  Normal in size and appearance.  Pancreas:  Normal in appearance.  No pancreatic ductal dilation.  Adrenal glands:  Normal in appearance.  Genitourinary: Cortical cysts involving both kidneys, the largest arising from the lower pole of the left kidney measuring approximately 5.6 x 5.0 cm, unchanged. No significant abnormality involving either kidney. No hydronephrosis. Urinary bladder decompressed and unremarkable.  Borderline median lobe prostate gland enlargement. Normal seminal vesicles.  Gastrointestinal: Normal-appearing decompressed stomach. Normal-appearing small bowel. Wall thickening involving a long segment of the mid and distal sigmoid colon with associated edema/inflammation in the adjacent fat. Several extraluminal gas bubbles adjacent to the mid descending colon associated with a small extraluminal fluid collection measuring approximately 2.5 cm. Normal appendix in the right mid abdomen as the cecum is positioned in the  right upper quadrant.  Vascular: Mild distal abdominal aortic atherosclerosis. Widely patent visceral arteries. Visualized venous structures widely patent.  Lymphatic:  No pathologic lymphadenopathy in the abdomen or pelvis.  Ascites: Absent.  Musculoskeletal: Lower thoracic spondylosis. Mild facet degenerative changes involving the lower lumbar spine.  Other findings: Midline anterior abdominal wall hernia just above the umbilicus with  the defect approximating 1.7 cm, new since the prior examination, likely related to the the prior laparoscopic portal.  Visualized lower thorax: Heart size normal. Pleuroparenchymal scarring involving both lung bases.  IMPRESSION: 1. Acute diverticulitis involving the mid to distal sigmoid colon with an approximate 2.5 cm abscess adjacent to the mid sigmoid colon and associated extraluminal gas. 2. Midline anterior abdominal wall hernia just above the umbilicus with the defect measuring approximately 1.7 cm, likely at the site of the prior laparoscopy portal. 3. Stable approximate 5-6 cm simple cyst arising from the lower pole of the left kidney. 4. Mild diffuse hepatic steatosis without focal hepatic parenchymal abnormality.   Electronically Signed   By: Evangeline Dakin M.D.   On: 05/13/2014 17:50    Review of Systems  All other systems reviewed and are negative.   Blood pressure 129/81, pulse 85, temperature 98.7 F (37.1 C), temperature source Oral, resp. rate 16, SpO2 99 %. Physical Exam  Constitutional: He is oriented to person, place, and time. He appears well-developed and well-nourished. No distress.  Obese  HENT:  Head: Normocephalic and atraumatic.  Right Ear: External ear normal.  Left Ear: External ear normal.  Nose: Nose normal.  Mouth/Throat: Oropharynx is clear and moist. No oropharyngeal exudate.  Eyes: Conjunctivae are normal. Pupils are equal, round, and reactive to light. No scleral icterus.  Neck: Normal range of motion. No tracheal deviation  present.  Cardiovascular: Normal rate, regular rhythm, normal heart sounds and intact distal pulses.   No murmur heard. Respiratory: Effort normal and breath sounds normal. No respiratory distress. He has no wheezes.  GI: Soft. He exhibits no distension. There is tenderness. There is guarding.  There is mild tenderness with guarding in the mid low abdomen to the right. There is a well-healed incision above the umbilicus with a hernia containing incarcerated omentum  Musculoskeletal: Normal range of motion. He exhibits no edema or tenderness.  Lymphadenopathy:    He has no cervical adenopathy.  Neurological: He is alert and oriented to person, place, and time.  Skin: Skin is warm and dry. No rash noted. No erythema.  Psychiatric: His behavior is normal. Judgment normal.     Assessment/Plan Diverticulitis with abscess  I discussed the diagnosis with the patient and his wife. This is a small abscess and does not appear that he can be drained by interventional radiology. He does not need an emergent exploratory laparotomy. Plan will be to admit him to the hospital for IV antibiotics and bowel rest if he does not improve over the next several days, he may need a repeat CAT scan to see if the abscess is larger. If it still cannot be drained or he acutely worsens, he may require an exploratory laparotomy with resection and colostomy. He understands and agrees with the admission.Marland Kitchen    Madicyn Mesina A 05/13/2014, 9:35 PM

## 2014-05-14 ENCOUNTER — Encounter (HOSPITAL_COMMUNITY): Payer: Self-pay | Admitting: General Surgery

## 2014-05-14 LAB — BASIC METABOLIC PANEL
Anion gap: 11 (ref 5–15)
BUN: 10 mg/dL (ref 6–23)
CO2: 23 mmol/L (ref 19–32)
Calcium: 8.4 mg/dL (ref 8.4–10.5)
Chloride: 106 mmol/L (ref 96–112)
Creatinine, Ser: 1.07 mg/dL (ref 0.50–1.35)
GFR calc Af Amer: >90 mL/min (ref >90)
GFR calc non Af Amer: 79 mL/min — ABNORMAL LOW (ref >90)
Glucose, Bld: 94 mg/dL (ref 70–99)
Potassium: 3.6 mmol/L (ref 3.5–5.1)
SODIUM: 140 mmol/L (ref 135–145)

## 2014-05-14 LAB — CBC
HEMATOCRIT: 38.4 % — AB (ref 39.0–52.0)
Hemoglobin: 12.9 g/dL — ABNORMAL LOW (ref 13.0–17.0)
MCH: 29.6 pg (ref 26.0–34.0)
MCHC: 33.6 g/dL (ref 30.0–36.0)
MCV: 88.1 fL (ref 78.0–100.0)
PLATELETS: 265 10*3/uL (ref 150–400)
RBC: 4.36 MIL/uL (ref 4.22–5.81)
RDW: 13.9 % (ref 11.5–15.5)
WBC: 10.5 10*3/uL (ref 4.0–10.5)

## 2014-05-14 NOTE — Progress Notes (Signed)
[facility] Progress Note     Subjective: Pt feels good, pain nearly resolved, thirsty.  Has been ambulating well.  No N/V.  Having loose BM's.  Wife at bedside.    Objective: Vital signs in last 24 hours: Temp:  [98.7 F (37.1 C)-100.2 F (37.9 C)] 98.7 F (37.1 C) (04/23 0536) Pulse Rate:  [76-100] 77 (04/23 0536) Resp:  [16-18] 16 (04/23 0536) BP: (110-151)/(60-96) 124/68 mmHg (04/23 0536) SpO2:  [92 %-100 %] 100 % (04/23 0536) Weight:  [126.724 kg (279 lb 6 oz)] 126.724 kg (279 lb 6 oz) (04/22 2317) Last BM Date: 05/11/14  Intake/Output from previous day: 04/22 0701 - 04/23 0700 In: 1300 [I.V.:1300] Out: -  Intake/Output this shift:    PE: Gen:  Alert, NAD, pleasant Abd: Soft, ND, minimally tender in the LLQ, +BS, no HSM   Lab Results:   Recent Labs  05/13/14 1334 05/14/14 0336  WBC 13.5* 10.5  HGB 14.1 12.9*  HCT 42.2 38.4*  PLT 291 265   BMET  Recent Labs  05/13/14 1334 05/13/14 1445 05/14/14 0336  NA 139  --  140  K 3.5  --  3.6  CL 103  --  106  CO2 25  --  23  GLUCOSE 105*  --  94  BUN 10  --  10  CREATININE 1.15 1.10 1.07  CALCIUM 9.0  --  8.4   PT/INR No results for input(s): LABPROT, INR in the last 72 hours. CMP     Component Value Date/Time   NA 140 05/14/2014 0336   K 3.6 05/14/2014 0336   CL 106 05/14/2014 0336   CO2 23 05/14/2014 0336   GLUCOSE 94 05/14/2014 0336   BUN 10 05/14/2014 0336   CREATININE 1.07 05/14/2014 0336   CALCIUM 8.4 05/14/2014 0336   PROT 7.5 05/13/2014 1334   ALBUMIN 3.5 05/13/2014 1334   AST 28 05/13/2014 1334   ALT 38 05/13/2014 1334   ALKPHOS 128* 05/13/2014 1334   BILITOT 1.0 05/13/2014 1334   GFRNONAA 79* 05/14/2014 0336   GFRAA >90 05/14/2014 0336   Lipase     Component Value Date/Time   LIPASE 19 05/13/2014 1334       Studies/Results: Ct Abdomen Pelvis W Contrast  05/13/2014   CLINICAL DATA:  Acute onset of right lower quadrant abdominal pain (8/10), fever, nausea,  leukocytosis and headache. Surgical history includes cholecystectomy.  EXAM: CT ABDOMEN AND PELVIS WITH CONTRAST  TECHNIQUE: Multidetector CT imaging of the abdomen and pelvis was performed using the standard protocol following bolus administration of intravenous contrast.  CONTRAST:  152mL OMNIPAQUE IOHEXOL 300 MG/ML IV. Oral contrast was also administered.  COMPARISON:  05/04/2009.  FINDINGS: Liver: Mild diffuse hepatic steatosis without focal parenchymal abnormality.  Biliary: Surgically absent gallbladder. No unexpected biliary ductal dilation.  Spleen:  Normal in size and appearance.  Pancreas:  Normal in appearance.  No pancreatic ductal dilation.  Adrenal glands:  Normal in appearance.  Genitourinary: Cortical cysts involving both kidneys, the largest arising from the lower pole of the left kidney measuring approximately 5.6 x 5.0 cm, unchanged. No significant abnormality involving either kidney. No hydronephrosis. Urinary bladder decompressed and unremarkable.  Borderline median lobe prostate gland enlargement. Normal seminal vesicles.  Gastrointestinal: Normal-appearing decompressed stomach. Normal-appearing small bowel. Wall thickening involving a long segment of the mid and distal sigmoid colon with associated edema/inflammation in the adjacent fat. Several extraluminal gas bubbles adjacent to the mid descending colon associated with a small extraluminal fluid  collection measuring approximately 2.5 cm. Normal appendix in the right mid abdomen as the cecum is positioned in the right upper quadrant.  Vascular: Mild distal abdominal aortic atherosclerosis. Widely patent visceral arteries. Visualized venous structures widely patent.  Lymphatic:  No pathologic lymphadenopathy in the abdomen or pelvis.  Ascites: Absent.  Musculoskeletal: Lower thoracic spondylosis. Mild facet degenerative changes involving the lower lumbar spine.  Other findings: Midline anterior abdominal wall hernia just above the umbilicus  with the defect approximating 1.7 cm, new since the prior examination, likely related to the the prior laparoscopic portal.  Visualized lower thorax: Heart size normal. Pleuroparenchymal scarring involving both lung bases.  IMPRESSION: 1. Acute diverticulitis involving the mid to distal sigmoid colon with an approximate 2.5 cm abscess adjacent to the mid sigmoid colon and associated extraluminal gas. 2. Midline anterior abdominal wall hernia just above the umbilicus with the defect measuring approximately 1.7 cm, likely at the site of the prior laparoscopy portal. 3. Stable approximate 5-6 cm simple cyst arising from the lower pole of the left kidney. 4. Mild diffuse hepatic steatosis without focal hepatic parenchymal abnormality.   Electronically Signed   By: Evangeline Dakin M.D.   On: 05/13/2014 17:50    Anti-infectives: Anti-infectives    Start     Dose/Rate Route Frequency Ordered Stop   05/13/14 2330  piperacillin-tazobactam (ZOSYN) IVPB 3.375 g     3.375 g 100 mL/hr over 30 Minutes Intravenous  Once 05/13/14 2326 05/14/14 0025   05/13/14 2315  piperacillin-tazobactam (ZOSYN) IVPB 3.375 g     3.375 g 12.5 mL/hr over 240 Minutes Intravenous 3 times per day 05/13/14 2308     05/13/14 1815  ciprofloxacin (CIPRO) IVPB 400 mg     400 mg 200 mL/hr over 60 Minutes Intravenous  Once 05/13/14 1803 05/13/14 1913   05/13/14 1815  metroNIDAZOLE (FLAGYL) IVPB 500 mg     500 mg 100 mL/hr over 60 Minutes Intravenous  Once 05/13/14 1803 05/13/14 2024       Assessment/Plan Diverticulitis with abscess -Abscess too small to drain by IR -Tx conservatively with IVF, pain control, antiemetics, and IV antibiotics -Plan for repeat CT scan in 5-6 days if not improved  -No need for emergent ex lap, but if he acutely worsens may need to go to OR -Start clears since clinically improving -Dietitian consult - nutritional counseling on obesity and diverticulitis Obesity - BMI 40 OSA    LOS: 1 day     DORT, Brianni Manthe 05/14/2014, 9:03 AM Pager: [phone]

## 2014-05-14 NOTE — Plan of Care (Signed)
Problem: Food- and Nutrition-Related Knowledge Deficit (NB-1.1) Goal: Nutrition education Formal process to instruct or train a patient/client in a skill or to impart knowledge to help patients/clients voluntarily manage or modify food choices and eating behavior to maintain or improve health. Outcome: Completed/Met Date Met:  05/14/14 Rd consulted for education regarding low residue/fiber diet and high fiber diet  RD provided "Low Fiber Nutrition Therapy" and "High Fiber nutrition Therapy" handouts from the Academy of [employer]. Discussed different food groups and their effects on the GI tract. For the first six weeks: avoid wheat rice, pasta, braed (stick to white). Also stay away from any foods with seeds or casings ie jelly, fruit with (unpeeled or with seeds), hotdogs, sausage, peas corn etc. Discussed limiting dairy, herbs, spices.   After 6 weeks discussed switching back to all whole grains, eating vegetables at each meal and trying to incorporate high fiber foods such as oatmeal into his diet.   Expect Very good compliance.  Body mass index is 40.09 kg/(m^2). Pt meets criteria for Morbidly obese based on current BMI.  Current diet order is Clear liquids. There is no document intake of meals at this time. Labs and medications reviewed. No further nutrition interventions warranted at this time. RD contact information provided. If additional nutrition issues arise, please re-consult RD.  Burtis Junes RD, LDN Nutrition Pager: [phone] 05/14/2014 12:49 PM

## 2014-05-15 MED ORDER — PANTOPRAZOLE SODIUM 40 MG PO TBEC
40.0000 mg | DELAYED_RELEASE_TABLET | Freq: Every day | ORAL | Status: DC
  Administered 2014-05-15: 40 mg via ORAL
  Filled 2014-05-15: qty 1

## 2014-05-15 MED ORDER — AMOXICILLIN-POT CLAVULANATE 875-125 MG PO TABS
1.0000 | ORAL_TABLET | Freq: Two times a day (BID) | ORAL | Status: DC
  Administered 2014-05-15 – 2014-05-16 (×3): 1 via ORAL
  Filled 2014-05-15 (×3): qty 1

## 2014-05-15 MED ORDER — HYDROCODONE-ACETAMINOPHEN 5-325 MG PO TABS: 1.0000 | ORAL_TABLET | ORAL | Status: DC | PRN

## 2014-05-15 NOTE — Progress Notes (Signed)
[facility] Progress Note     Subjective: Pt has no pain, no N/V.  Ambulating well.  Tolerating clears and wants to advance diet.  Having flatus, BM yesterday.  No other complaints.    Objective: Vital signs in last 24 hours: Temp:  [97.8 F (36.6 C)-98.7 F (37.1 C)] 97.8 F (36.6 C) (04/24 0552) Pulse Rate:  [63-84] 63 (04/24 0552) Resp:  [12-18] 12 (04/24 0552) BP: (128-140)/(79-90) 139/90 mmHg (04/24 0552) SpO2:  [84 %-100 %] 100 % (04/24 0552) Last BM Date: 05/14/14  Intake/Output from previous day: 04/23 0701 - 04/24 0700 In: 240 [P.O.:240] Out: -  Intake/Output this shift:    PE: Gen:  Alert, NAD, pleasant Abd: Obese, soft, NT/ND, +BS, no HSM, abdominal scars noted above umbilicus   Lab Results:   Recent Labs  05/13/14 1334 05/14/14 0336  WBC 13.5* 10.5  HGB 14.1 12.9*  HCT 42.2 38.4*  PLT 291 265   BMET  Recent Labs  05/13/14 1334 05/13/14 1445 05/14/14 0336  NA 139  --  140  K 3.5  --  3.6  CL 103  --  106  CO2 25  --  23  GLUCOSE 105*  --  94  BUN 10  --  10  CREATININE 1.15 1.10 1.07  CALCIUM 9.0  --  8.4   PT/INR No results for input(s): LABPROT, INR in the last 72 hours. CMP     Component Value Date/Time   NA 140 05/14/2014 0336   K 3.6 05/14/2014 0336   CL 106 05/14/2014 0336   CO2 23 05/14/2014 0336   GLUCOSE 94 05/14/2014 0336   BUN 10 05/14/2014 0336   CREATININE 1.07 05/14/2014 0336   CALCIUM 8.4 05/14/2014 0336   PROT 7.5 05/13/2014 1334   ALBUMIN 3.5 05/13/2014 1334   AST 28 05/13/2014 1334   ALT 38 05/13/2014 1334   ALKPHOS 128* 05/13/2014 1334   BILITOT 1.0 05/13/2014 1334   GFRNONAA 79* 05/14/2014 0336   GFRAA >90 05/14/2014 0336   Lipase     Component Value Date/Time   LIPASE 19 05/13/2014 1334       Studies/Results: Ct Abdomen Pelvis W Contrast  05/13/2014   CLINICAL DATA:  Acute onset of right lower quadrant abdominal pain (8/10), fever, nausea, leukocytosis and headache. Surgical history  includes cholecystectomy.  EXAM: CT ABDOMEN AND PELVIS WITH CONTRAST  TECHNIQUE: Multidetector CT imaging of the abdomen and pelvis was performed using the standard protocol following bolus administration of intravenous contrast.  CONTRAST:  147mL OMNIPAQUE IOHEXOL 300 MG/ML IV. Oral contrast was also administered.  COMPARISON:  05/04/2009.  FINDINGS: Liver: Mild diffuse hepatic steatosis without focal parenchymal abnormality.  Biliary: Surgically absent gallbladder. No unexpected biliary ductal dilation.  Spleen:  Normal in size and appearance.  Pancreas:  Normal in appearance.  No pancreatic ductal dilation.  Adrenal glands:  Normal in appearance.  Genitourinary: Cortical cysts involving both kidneys, the largest arising from the lower pole of the left kidney measuring approximately 5.6 x 5.0 cm, unchanged. No significant abnormality involving either kidney. No hydronephrosis. Urinary bladder decompressed and unremarkable.  Borderline median lobe prostate gland enlargement. Normal seminal vesicles.  Gastrointestinal: Normal-appearing decompressed stomach. Normal-appearing small bowel. Wall thickening involving a long segment of the mid and distal sigmoid colon with associated edema/inflammation in the adjacent fat. Several extraluminal gas bubbles adjacent to the mid descending colon associated with a small extraluminal fluid collection measuring approximately 2.5 cm. Normal appendix in the right mid abdomen  as the cecum is positioned in the right upper quadrant.  Vascular: Mild distal abdominal aortic atherosclerosis. Widely patent visceral arteries. Visualized venous structures widely patent.  Lymphatic:  No pathologic lymphadenopathy in the abdomen or pelvis.  Ascites: Absent.  Musculoskeletal: Lower thoracic spondylosis. Mild facet degenerative changes involving the lower lumbar spine.  Other findings: Midline anterior abdominal wall hernia just above the umbilicus with the defect approximating 1.7 cm, new  since the prior examination, likely related to the the prior laparoscopic portal.  Visualized lower thorax: Heart size normal. Pleuroparenchymal scarring involving both lung bases.  IMPRESSION: 1. Acute diverticulitis involving the mid to distal sigmoid colon with an approximate 2.5 cm abscess adjacent to the mid sigmoid colon and associated extraluminal gas. 2. Midline anterior abdominal wall hernia just above the umbilicus with the defect measuring approximately 1.7 cm, likely at the site of the prior laparoscopy portal. 3. Stable approximate 5-6 cm simple cyst arising from the lower pole of the left kidney. 4. Mild diffuse hepatic steatosis without focal hepatic parenchymal abnormality.   Electronically Signed   By: Evangeline Dakin M.D.   On: 05/13/2014 17:50    Anti-infectives: Anti-infectives    Start     Dose/Rate Route Frequency Ordered Stop   05/13/14 2330  piperacillin-tazobactam (ZOSYN) IVPB 3.375 g     3.375 g 100 mL/hr over 30 Minutes Intravenous  Once 05/13/14 2326 05/14/14 0025   05/13/14 2315  piperacillin-tazobactam (ZOSYN) IVPB 3.375 g     3.375 g 12.5 mL/hr over 240 Minutes Intravenous 3 times per day 05/13/14 2308     05/13/14 1815  ciprofloxacin (CIPRO) IVPB 400 mg     400 mg 200 mL/hr over 60 Minutes Intravenous  Once 05/13/14 1803 05/13/14 1913   05/13/14 1815  metroNIDAZOLE (FLAGYL) IVPB 500 mg     500 mg 100 mL/hr over 60 Minutes Intravenous  Once 05/13/14 1803 05/13/14 2024       Assessment/Plan Diverticulitis with abscess -Abscess too small to drain by IR -Tx conservatively with IVF, pain control, antiemetics, and IV antibiotics -Plan for repeat CT scan as OP to check resolution of abscess -No need for emergent ex lap, but if he acutely worsens may need to go to OR -Advance diet to fulls today, soft at dinner -Dietitian consult - nutritional counseling on obesity and diverticulitis -Switch to oral abx, recheck CBC in am and if fine can d/c home to finish abx  tomorrow Obesity - BMI 40 OSA    LOS: 2 days    Joshua Mcdonald 05/15/2014, 7:35 AM Pager: [phone]

## 2014-05-16 LAB — CBC
HEMATOCRIT: 37.7 % — AB (ref 39.0–52.0)
Hemoglobin: 12.6 g/dL — ABNORMAL LOW (ref 13.0–17.0)
MCH: 29.2 pg (ref 26.0–34.0)
MCHC: 33.4 g/dL (ref 30.0–36.0)
MCV: 87.3 fL (ref 78.0–100.0)
Platelets: 315 10*3/uL (ref 150–400)
RBC: 4.32 MIL/uL (ref 4.22–5.81)
RDW: 13.5 % (ref 11.5–15.5)
WBC: 5.6 10*3/uL (ref 4.0–10.5)

## 2014-05-16 MED ORDER — AMOXICILLIN-POT CLAVULANATE 875-125 MG PO TABS: 1.0000 | ORAL_TABLET | Freq: Two times a day (BID) | ORAL | Status: DC

## 2014-05-16 NOTE — Discharge Summary (Signed)
   [facility] Discharge Summary   Patient ID: Joshua Mcdonald MRN: [id number] DOB/AGE: 02-01-1965 49 y.o.  Admit date: 05/13/2014 Discharge date: 05/16/2014  Admitting Diagnosis: Diverticulitis with abscess  Discharge Diagnosis Patient Active Problem List   Diagnosis Date Noted  . Diverticulitis of intestine with abscess 05/13/2014  . HYPERLIPIDEMIA 11/23/2007  . Obstructive sleep apnea 11/23/2007    Consultants None  Imaging: No results found.  Procedures None  Hospital Course:  49 y/o white male presents with a 2 day history of lower abdominal pain. He reports that it came on suddenly Wednesday night. He describes it as a sharp cramping pain. It moves to the right lower quadrant. He has had mucousy bowel movements. He has had nausea but no vomiting. He denies fevers. He has no previous history of similar abdominal pain. He is otherwise without complaints.  Workup showed diverticulitis with 2.5cm abscess, and a umbilical abdominal wall hernia.  Patient was admitted and was transferred to the floor for conservative management with NPO, IVF, pain control, antiemetics and IV antibiotics (zosyn).  His pain and WBC soon resolved and his diet was advanced as tolerated.  Today his WBC is normal at 5.6 and without pain.  On HD #4, the patient was voiding well, tolerating diet, ambulating well, pain well controlled, vital signs stable, and felt stable for discharge home.  Patient will follow up in our office in 2-3 weeks and will get a repeat CT scan in 1-2 weeks to check for resolution of abscess.  He knows to call with questions or concerns.  He will finish a total of 14 days of antibiotics.     Physical Exam: General:  Alert, NAD, pleasant, comfortable Abd:  Soft, ND, mild tenderness, no HSM, scar at umbilicus     Medication List    TAKE these medications        amoxicillin-clavulanate 875-125 MG per tablet  Commonly known as:  AUGMENTIN  Take 1 tablet by mouth  every 12 (twelve) hours.     ezetimibe-simvastatin 10-40 MG per tablet  Commonly known as:  VYTORIN  Take 1 tablet by mouth at bedtime.     ezetimibe-simvastatin 10-20 MG per tablet (Check with your primary care to see which one you need to be on.)  Commonly known as:  VYTORIN  Take 1 tablet by mouth at bedtime.     levothyroxine 175 MCG tablet  Commonly known as:  SYNTHROID, LEVOTHROID  Take 175 mcg by mouth daily before breakfast.     sertraline 50 MG tablet  Commonly known as:  ZOLOFT  Take 50 mg by mouth daily.         Follow-up Information    Schedule an appointment as soon as possible for a visit with Harl Bowie, MD.   Specialty:  General Surgery   Why:  For post-hospital follow up within 2-3 weeks, you will need a repeat CT scan in 1-2 weeks, For post-hospital follow up   Contact information:   [address] [place] [address] [phone]       Signed: Excell Seltzer [facility] [phone]  05/16/2014, 10:00 AM

## 2014-05-16 NOTE — Progress Notes (Signed)
Patient ready for discharge to home; discharge instructions given and reviewed with patient; patient ambulated out for discharge home; wife accompanying patient to home.

## 2014-05-16 NOTE — Progress Notes (Signed)
UR complete.  Aireanna Luellen RN, MSN 

## 2014-05-20 ENCOUNTER — Other Ambulatory Visit: Payer: Self-pay | Admitting: Surgery

## 2014-05-20 DIAGNOSIS — M8550 Aneurysmal bone cyst, unspecified site: Secondary | ICD-10-CM

## 2014-05-20 DIAGNOSIS — IMO0002 Reserved for concepts with insufficient information to code with codable children: Secondary | ICD-10-CM

## 2014-05-20 DIAGNOSIS — IMO0001 Reserved for inherently not codable concepts without codable children: Secondary | ICD-10-CM

## 2014-05-20 DIAGNOSIS — K651 Peritoneal abscess: Secondary | ICD-10-CM

## 2014-05-20 DIAGNOSIS — T814XXD Infection following a procedure, subsequent encounter: Secondary | ICD-10-CM

## 2014-06-02 ENCOUNTER — Ambulatory Visit
Admission: RE | Admit: 2014-06-02 | Discharge: 2014-06-02 | Disposition: A | Discharge status: Home / Self Care | Payer: BLUE CROSS/BLUE SHIELD | Source: Ambulatory Visit | Attending: Surgery | Admitting: Surgery

## 2014-06-02 MED ORDER — IOHEXOL 300 MG/ML  SOLN
125.0000 mL | Freq: Once | INTRAMUSCULAR | Status: AC | PRN
  Administered 2014-06-02: 125 mL via INTRAVENOUS

## 2014-09-05 ENCOUNTER — Encounter: Payer: Self-pay | Admitting: Pulmonary Disease

## 2014-10-27 ENCOUNTER — Ambulatory Visit: Payer: BC Managed Care – PPO | Admitting: Pulmonary Disease

## 2014-11-21 ENCOUNTER — Ambulatory Visit (AMBULATORY_SURGERY_CENTER): Payer: Self-pay | Admitting: *Deleted

## 2014-11-21 VITALS — Ht 70.0 in | Wt 283.4 lb

## 2014-11-21 DIAGNOSIS — Z1211 Encounter for screening for malignant neoplasm of colon: Secondary | ICD-10-CM

## 2014-11-21 MED ORDER — NA SULFATE-K SULFATE-MG SULF 17.5-3.13-1.6 GM/177ML PO SOLN: 1.0000 | Freq: Once | ORAL | Status: DC

## 2014-11-21 NOTE — Progress Notes (Signed)
Denies allergies to eggs or soy products. Denies complications with sedation or anesthesia. Denies O2 use. Denies use of diet or weight loss medications.  Emmi instructions given for colonoscopy.  

## 2014-12-01 ENCOUNTER — Encounter: Payer: BLUE CROSS/BLUE SHIELD | Admitting: Internal Medicine

## 2015-02-08 ENCOUNTER — Encounter: Payer: Managed Care, Other (non HMO) | Admitting: Internal Medicine

## 2015-03-01 ENCOUNTER — Telehealth: Payer: Self-pay | Admitting: Internal Medicine

## 2015-03-01 DIAGNOSIS — Z1211 Encounter for screening for malignant neoplasm of colon: Secondary | ICD-10-CM

## 2015-03-01 MED ORDER — NA SULFATE-K SULFATE-MG SULF 17.5-3.13-1.6 GM/177ML PO SOLN: 1.0000 | Freq: Once | ORAL | Status: DC

## 2015-03-01 NOTE — Telephone Encounter (Signed)
Called patient back, no answer, left him a message. Told pt he may go by [employer] [facility] and pick up prep=Suprep. I did resend this to his pharmacy at this time.

## 2015-03-08 ENCOUNTER — Ambulatory Visit (AMBULATORY_SURGERY_CENTER): Payer: Managed Care, Other (non HMO) | Admitting: Internal Medicine

## 2015-03-08 ENCOUNTER — Encounter: Payer: Self-pay | Admitting: Internal Medicine

## 2015-03-08 VITALS — BP 124/78 | HR 59 | Temp 98.7°F | Resp 28 | Ht 70.0 in | Wt 283.0 lb

## 2015-03-08 DIAGNOSIS — D120 Benign neoplasm of cecum: Secondary | ICD-10-CM | POA: Diagnosis not present

## 2015-03-08 DIAGNOSIS — D123 Benign neoplasm of transverse colon: Secondary | ICD-10-CM

## 2015-03-08 DIAGNOSIS — D125 Benign neoplasm of sigmoid colon: Secondary | ICD-10-CM

## 2015-03-08 DIAGNOSIS — Z1211 Encounter for screening for malignant neoplasm of colon: Secondary | ICD-10-CM | POA: Diagnosis present

## 2015-03-08 DIAGNOSIS — K5140 Inflammatory polyps of colon without complications: Secondary | ICD-10-CM | POA: Diagnosis not present

## 2015-03-08 MED ORDER — SODIUM CHLORIDE 0.9 % IV SOLN: 500.0000 mL | INTRAVENOUS | Status: DC

## 2015-03-08 NOTE — Op Note (Signed)
[facility]  [address] [place], [id number]   COLONOSCOPY PROCEDURE REPORT  PATIENT: Joshua Mcdonald, Joshua Mcdonald  MR#: [id number] BIRTHDATE: November 23, 1965 , 50  yrs. old GENDER: male ENDOSCOPIST: Eustace Quail, MD REFERRED PY:[id number] Avva, M.D. PROCEDURE DATE:  03/08/2015 PROCEDURE:   Colonoscopy, screening and Colonoscopy with snare polypectomy x 5 First Screening Colonoscopy - Avg.  risk and is 50 yrs.  old or older Yes.  Prior Negative Screening - Now for repeat screening. N/A  History of Adenoma - Now for follow-up colonoscopy & has been > or = to 3 yrs.  N/A  Polyps removed today? Yes ASA CLASS:   Class II INDICATIONS:Screening for colonic neoplasia and Colorectal Neoplasm Risk Assessment for this procedure is average risk. MEDICATIONS: Monitored anesthesia care and Propofol 450 mg IV  DESCRIPTION OF PROCEDURE:   After the risks benefits and alternatives of the procedure were thoroughly explained, informed consent was obtained.  The digital rectal exam revealed no abnormalities of the rectum.   The LB [id number]  endoscope was introduced through the anus and advanced to the cecum, which was identified by both the appendix and ileocecal valve. No adverse events experienced.   The quality of the prep was excellent. (Suprep was used)  The instrument was then slowly withdrawn as the colon was fully examined. Estimated blood loss is zero unless otherwise noted in this procedure report.   COLON FINDINGS: Five polyps were found at the cecum (5 mm sessile serrated appearing, and two 3 mm adenomatous-appearing polyps), in the transverse colon(6 mm inflammatory appearing), and sigmoid colon(6 mm inflammatory appearing).  A polypectomy was performed with a cold snare.  The resection was complete, the polyp tissue was completely retrieved and sent to histology.   The examination was otherwise normal.  Retroflexed views revealed internal hemorrhoids. The time to cecum =  3.6 Withdrawal time = 17.7   The scope was withdrawn and the procedure completed.  COMPLICATIONS: There were no immediate complications.  ENDOSCOPIC IMPRESSION: 1.   Five polyps were found in the colon; polypectomy was performed with a cold snare 2.   The examination was otherwise normal  RECOMMENDATIONS: 1. Repeat Colonoscopy in 3 years (multiple polyps).  eSigned:  Eustace Quail, MD 03/08/2015 11:34 AM   cc: The Patient and Prince Solian, MD

## 2015-03-08 NOTE — Progress Notes (Signed)
Called to room to assist during endoscopic procedure.  Patient ID and intended procedure confirmed with present staff. Received instructions for my participation in the procedure from the performing physician.  

## 2015-03-08 NOTE — Progress Notes (Signed)
To Pacu-Pt awake and alert  Report to RN 

## 2015-03-08 NOTE — Patient Instructions (Signed)
Discharge instructions given. Handout on polyps. Resume previous medications. YOU HAD AN ENDOSCOPIC PROCEDURE TODAY AT THE [facility]:   Refer to the procedure report that was given to you for any specific questions about what was found during the examination.  If the procedure report does not answer your questions, please call your gastroenterologist to clarify.  If you requested that your care partner not be given the details of your procedure findings, then the procedure report has been included in a sealed envelope for you to review at your convenience later.  YOU SHOULD EXPECT: Some feelings of bloating in the abdomen. Passage of more gas than usual.  Walking can help get rid of the air that was put into your GI tract during the procedure and reduce the bloating. If you had a lower endoscopy (such as a colonoscopy or flexible sigmoidoscopy) you may notice spotting of blood in your stool or on the toilet paper. If you underwent a bowel prep for your procedure, you may not have a normal bowel movement for a few days.  Please Note:  You might notice some irritation and congestion in your nose or some drainage.  This is from the oxygen used during your procedure.  There is no need for concern and it should clear up in a day or so.  SYMPTOMS TO REPORT IMMEDIATELY:   Following lower endoscopy (colonoscopy or flexible sigmoidoscopy):  Excessive amounts of blood in the stool  Significant tenderness or worsening of abdominal pains  Swelling of the abdomen that is new, acute  Fever of 100F or higher   For urgent or emergent issues, a gastroenterologist can be reached at any hour by calling [phone].   DIET: Your first meal following the procedure should be a small meal and then it is ok to progress to your normal diet. Heavy or fried foods are harder to digest and may make you feel nauseous or bloated.  Likewise, meals heavy in dairy and vegetables can increase bloating.  Drink  plenty of fluids but you should avoid alcoholic beverages for 24 hours.  ACTIVITY:  You should plan to take it easy for the rest of today and you should NOT DRIVE or use heavy machinery until tomorrow (because of the sedation medicines used during the test).    FOLLOW UP: Our staff will call the number listed on your records the next business day following your procedure to check on you and address any questions or concerns that you may have regarding the information given to you following your procedure. If we do not reach you, we will leave a message.  However, if you are feeling well and you are not experiencing any problems, there is no need to return our call.  We will assume that you have returned to your regular daily activities without incident.  If any biopsies were taken you will be contacted by phone or by letter within the next 1-3 weeks.  Please call us at [phone] if you have not heard about the biopsies in 3 weeks.    SIGNATURES/CONFIDENTIALITY: You and/or your care partner have signed paperwork which will be entered into your electronic medical record.  These signatures attest to the fact that that the information above on your After Visit Summary has been reviewed and is understood.  Full responsibility of the confidentiality of this discharge information lies with you and/or your care-partner. 

## 2015-03-09 ENCOUNTER — Telehealth: Payer: Self-pay

## 2015-03-09 NOTE — Telephone Encounter (Signed)
  Follow up Call-  Call back number 03/08/2015  Post procedure Call Back phone  # [phone]  Permission to leave phone message Yes     Patient questions:  Do you have a fever, pain , or abdominal swelling? No. Pain Score  0 *  Have you tolerated food without any problems? Yes.    Have you been able to return to your normal activities? Yes.    Do you have any questions about your discharge instructions: Diet   No. Medications  No. Follow up visit  No.  Do you have questions or concerns about your Care? No.  Actions: * If pain score is 4 or above: No action needed, pain <4.  No problems per the pt. maw

## 2015-03-15 ENCOUNTER — Encounter: Payer: Self-pay | Admitting: Internal Medicine

## 2016-02-13 DIAGNOSIS — E784 Other hyperlipidemia: Secondary | ICD-10-CM | POA: Diagnosis not present

## 2016-02-13 DIAGNOSIS — E063 Autoimmune thyroiditis: Secondary | ICD-10-CM | POA: Diagnosis not present

## 2016-02-13 DIAGNOSIS — Z125 Encounter for screening for malignant neoplasm of prostate: Secondary | ICD-10-CM | POA: Diagnosis not present

## 2016-02-13 DIAGNOSIS — R8299 Other abnormal findings in urine: Secondary | ICD-10-CM | POA: Diagnosis not present

## 2016-02-13 DIAGNOSIS — R7301 Impaired fasting glucose: Secondary | ICD-10-CM | POA: Diagnosis not present

## 2016-02-15 DIAGNOSIS — Z1389 Encounter for screening for other disorder: Secondary | ICD-10-CM | POA: Diagnosis not present

## 2016-02-15 DIAGNOSIS — D126 Benign neoplasm of colon, unspecified: Secondary | ICD-10-CM | POA: Diagnosis not present

## 2016-02-15 DIAGNOSIS — F341 Dysthymic disorder: Secondary | ICD-10-CM | POA: Diagnosis not present

## 2016-02-15 DIAGNOSIS — Z0000 Encounter for general adult medical examination without abnormal findings: Secondary | ICD-10-CM | POA: Diagnosis not present

## 2016-02-15 DIAGNOSIS — R7301 Impaired fasting glucose: Secondary | ICD-10-CM | POA: Diagnosis not present

## 2016-02-15 DIAGNOSIS — J302 Other seasonal allergic rhinitis: Secondary | ICD-10-CM | POA: Diagnosis not present

## 2016-02-15 DIAGNOSIS — R030 Elevated blood-pressure reading, without diagnosis of hypertension: Secondary | ICD-10-CM | POA: Diagnosis not present

## 2016-03-05 DIAGNOSIS — H11002 Unspecified pterygium of left eye: Secondary | ICD-10-CM | POA: Diagnosis not present

## 2016-03-05 DIAGNOSIS — H5203 Hypermetropia, bilateral: Secondary | ICD-10-CM | POA: Diagnosis not present

## 2016-03-05 DIAGNOSIS — H40033 Anatomical narrow angle, bilateral: Secondary | ICD-10-CM | POA: Diagnosis not present

## 2016-09-02 DIAGNOSIS — E784 Other hyperlipidemia: Secondary | ICD-10-CM | POA: Diagnosis not present

## 2016-09-02 DIAGNOSIS — E063 Autoimmune thyroiditis: Secondary | ICD-10-CM | POA: Diagnosis not present

## 2016-09-02 DIAGNOSIS — R7301 Impaired fasting glucose: Secondary | ICD-10-CM | POA: Diagnosis not present

## 2016-09-02 DIAGNOSIS — G4733 Obstructive sleep apnea (adult) (pediatric): Secondary | ICD-10-CM | POA: Diagnosis not present

## 2016-12-05 IMAGING — CT CT ABD-PELV W/ CM
2 of 5 series · 15 of 46 positions shown, 17 images · IV contrast (Omni 300)
Comparison: 05/04/2009.

CLINICAL DATA: Acute onset of right lower quadrant abdominal pain
([DATE]), fever, nausea, leukocytosis and headache. Surgical history
includes cholecystectomy.

EXAM:
CT ABDOMEN AND PELVIS WITH CONTRAST
TECHNIQUE: Multidetector CT imaging of the abdomen and pelvis was performed
using the standard protocol following bolus administration of
intravenous contrast.
CONTRAST:  100mL OMNIPAQUE IOHEXOL 300 MG/ML IV. Oral contrast was
also administered.

[Series 2: abd/ pelvis 5.0 i30f 1 · axial · 0.95mm/px · z∈[-852,-332]mm · 12 of 118 slices shown, 14 images]
[im 7/118  soft-tissue]
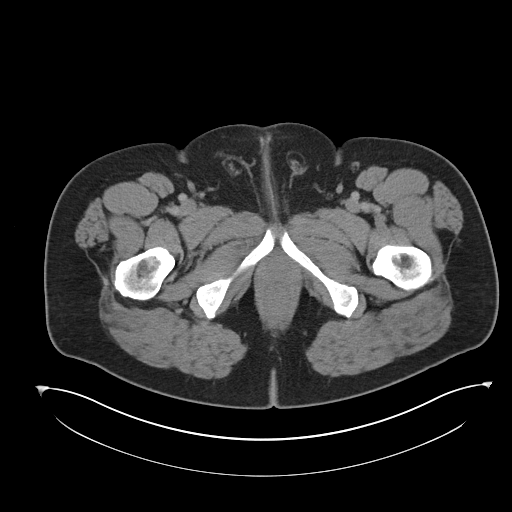
[im 7/118  bone]
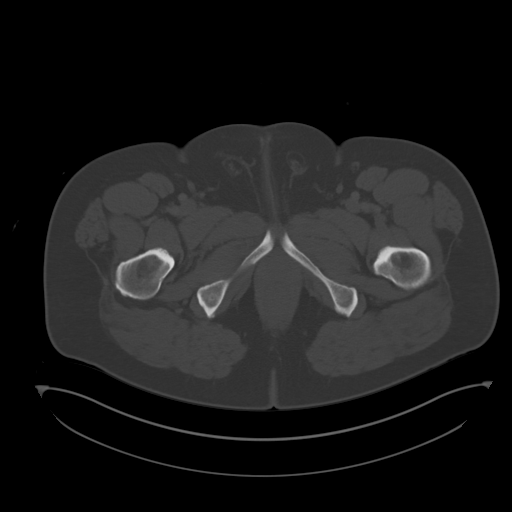
[im 21/118  soft-tissue]
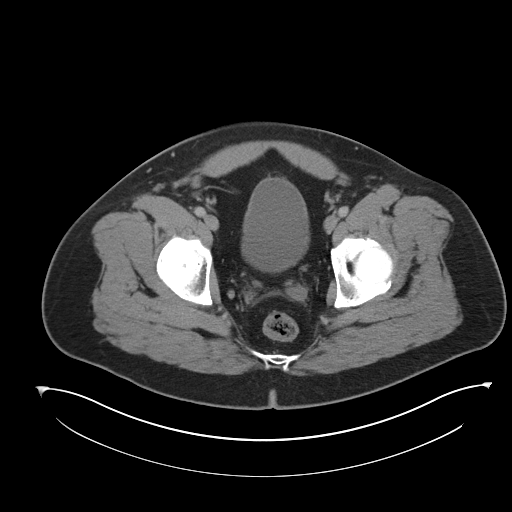
[im 28/118  soft-tissue]
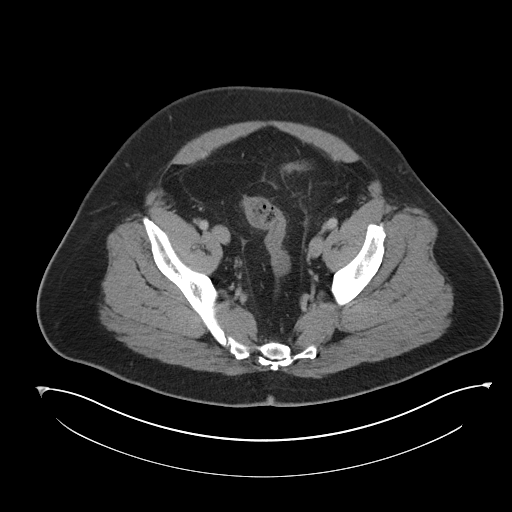
[im 35/118  soft-tissue]
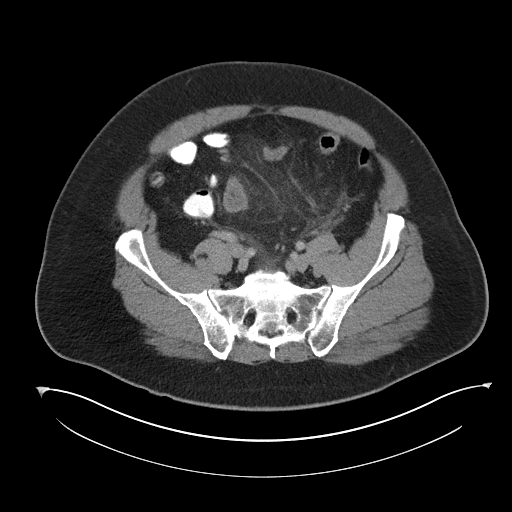
[im 49/118  soft-tissue]
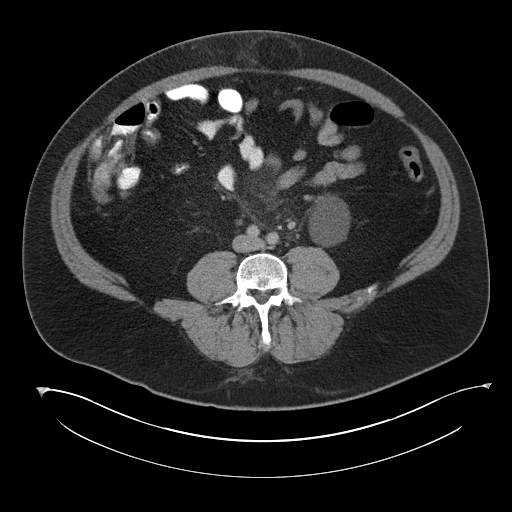
[im 56/118  soft-tissue]
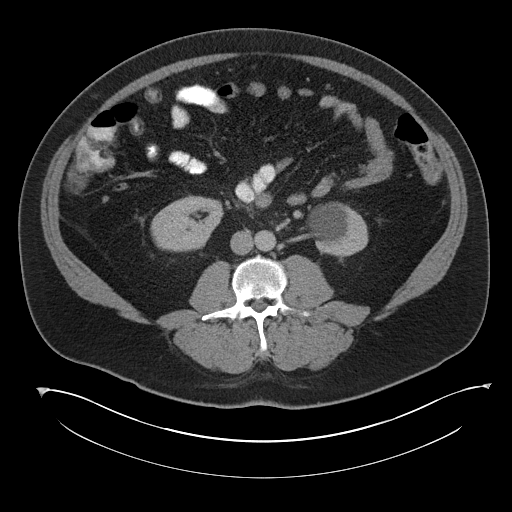
[im 62/118  soft-tissue]
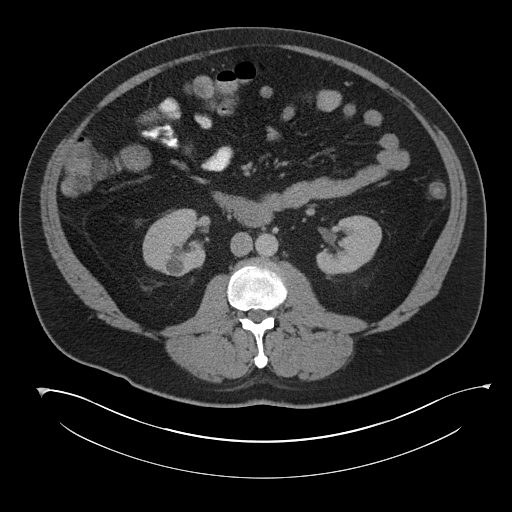
[im 76/118  soft-tissue]
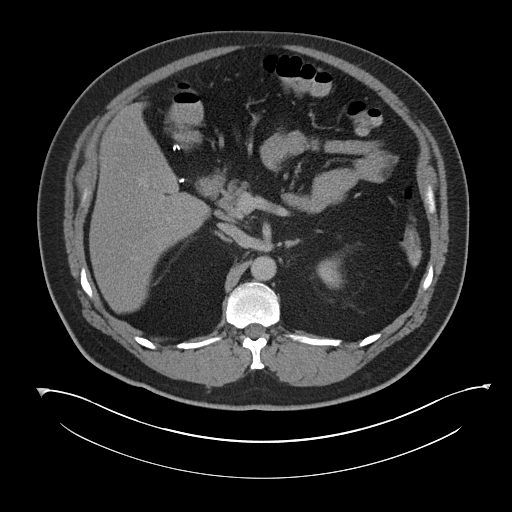
[im 83/118  soft-tissue]
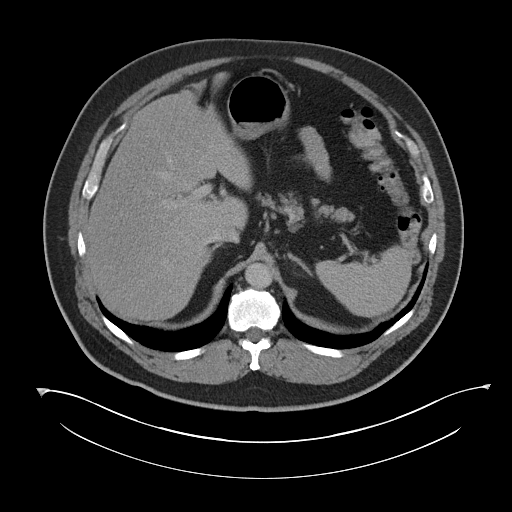
[im 83/118  bone]
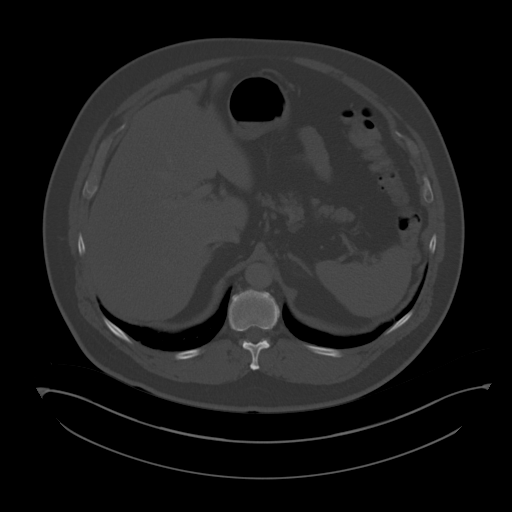
[im 90/118  soft-tissue]
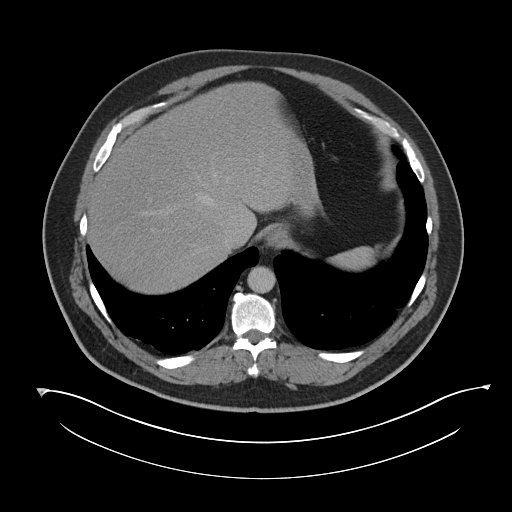
[im 104/118  soft-tissue]
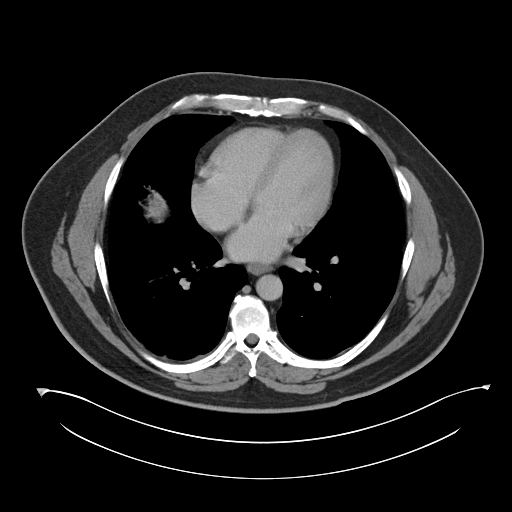
[im 111/118  soft-tissue]
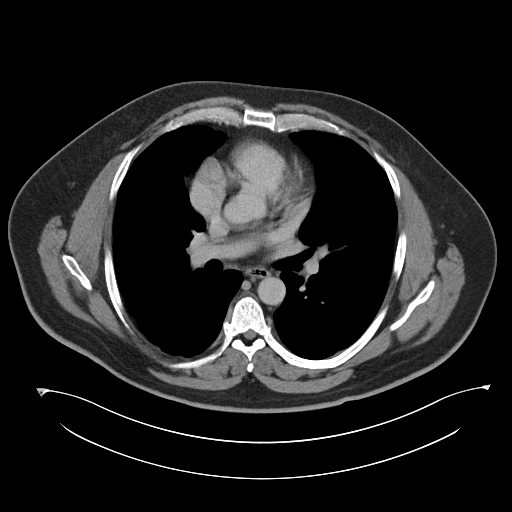

[Series 5: coronals · coronal · 0.89mm/px · 3 of 191 slices shown]
[im 64/191  soft-tissue]
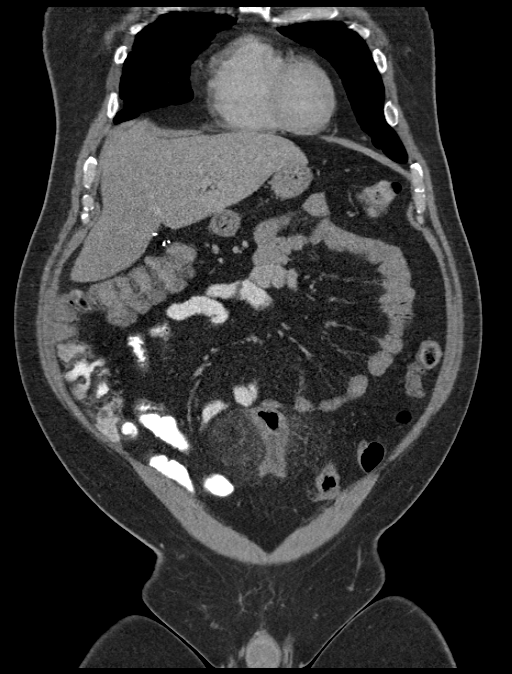
[im 85/191  soft-tissue]
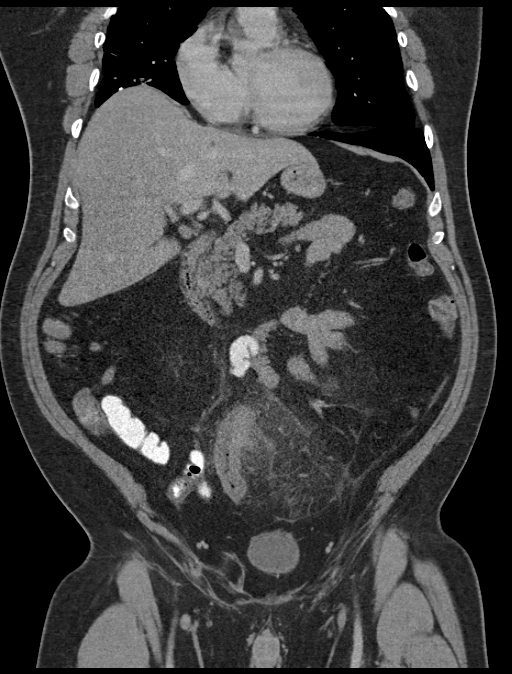
[im 106/191  soft-tissue]
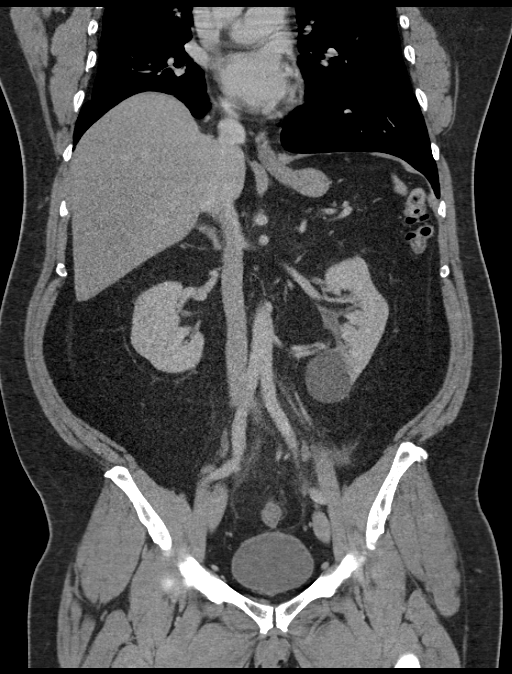

[15 of 46 positions shown; findings below may reference images not displayed]

FINDINGS: Liver: Mild diffuse hepatic steatosis without focal parenchymal
abnormality.

Biliary: Surgically absent gallbladder. No unexpected biliary ductal
dilation.

Spleen:  Normal in size and appearance.

Pancreas:  Normal in appearance.  No pancreatic ductal dilation.

Adrenal glands:  Normal in appearance.

Genitourinary: Cortical cysts involving both kidneys, the largest
arising from the lower pole of the left kidney measuring
approximately 5.6 x 5.0 cm, unchanged. No significant abnormality
involving either kidney. No hydronephrosis. Urinary bladder
decompressed and unremarkable.

Borderline median lobe prostate gland enlargement. Normal seminal
vesicles.

Gastrointestinal: Normal-appearing decompressed stomach.
Normal-appearing small bowel. Wall thickening involving a long
segment of the mid and distal sigmoid colon with associated
edema/inflammation in the adjacent fat. Several extraluminal gas
bubbles adjacent to the mid descending colon associated with a small
extraluminal fluid collection measuring approximately 2.5 cm. Normal
appendix in the right mid abdomen as the cecum is positioned in the
right upper quadrant.

Vascular: Mild distal abdominal aortic atherosclerosis. Widely
patent visceral arteries. Visualized venous structures widely
patent.

Lymphatic:  No pathologic lymphadenopathy in the abdomen or pelvis.

Ascites: Absent.

Musculoskeletal: Lower thoracic spondylosis. Mild facet degenerative
changes involving the lower lumbar spine.

Other findings: Midline anterior abdominal wall hernia just above
the umbilicus with the defect approximating 1.7 cm, new since the
prior examination, likely related to the the prior laparoscopic
portal.

Visualized lower thorax: Heart size normal. Pleuroparenchymal
scarring involving both lung bases.
IMPRESSION: 1. Acute diverticulitis involving the mid to distal sigmoid colon
with an approximate 2.5 cm abscess adjacent to the mid sigmoid colon
and associated extraluminal gas.
2. Midline anterior abdominal wall hernia just above the umbilicus
with the defect measuring approximately 1.7 cm, likely at the site
of the prior laparoscopy portal.
3. Stable approximate 5-6 cm simple cyst arising from the lower pole
of the left kidney.
4. Mild diffuse hepatic steatosis without focal hepatic parenchymal
abnormality.

## 2016-12-25 IMAGING — CT CT ABD-PELV W/ CM
1 of 3 series · 13 of 32 positions shown, 18 images · IV contrast (APPLIED)
Comparison: Prior CTs 05/13/2014 and 05/04/2009.

CLINICAL DATA: Follow up diverticular abscess post antibiotic
therapy completed 1 week ago. History of umbilical hernia repair.
Subsequent encounter.

EXAM:
CT ABDOMEN AND PELVIS WITH CONTRAST
TECHNIQUE: Multidetector CT imaging of the abdomen and pelvis was performed
using the standard protocol following bolus administration of
intravenous contrast.
CONTRAST:  125mL OMNIPAQUE IOHEXOL 300 MG/ML  SOLN

[Series 2: abd pelvis 5.0 i41s 1 · axial · 0.98mm/px · z∈[-573,-78]mm · 13 of 111 slices shown, 18 images]
[im 6/111  soft-tissue]
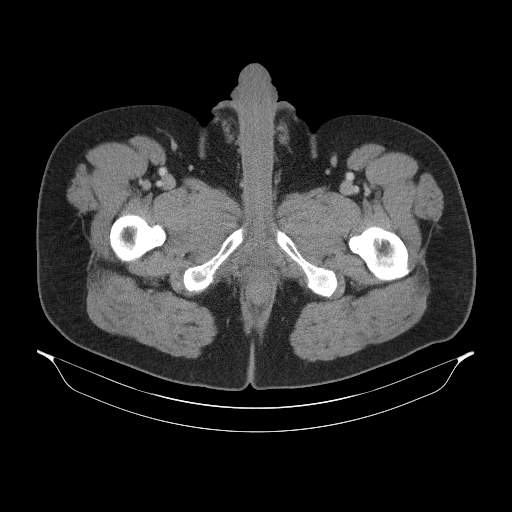
[im 6/111  bone]
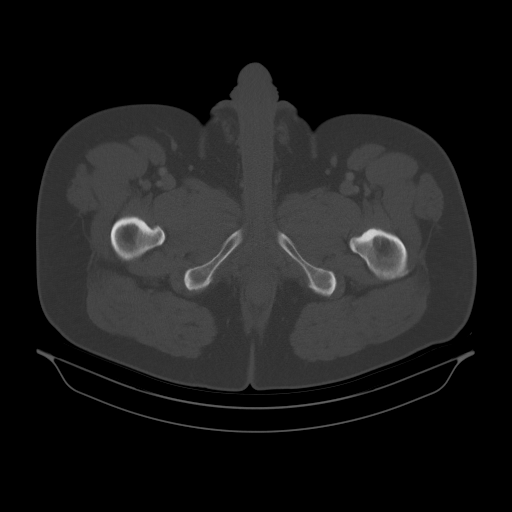
[im 18/111  soft-tissue]
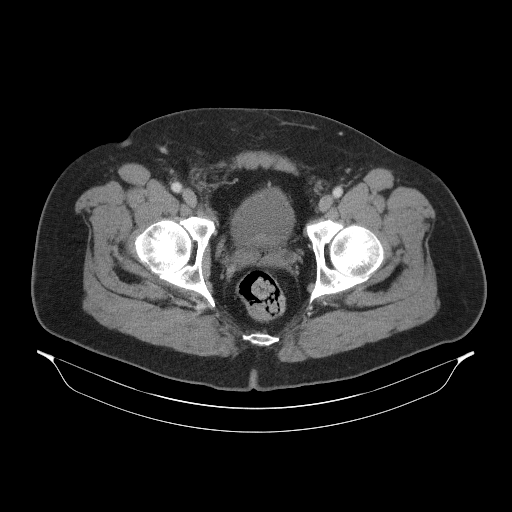
[im 24/111  soft-tissue]
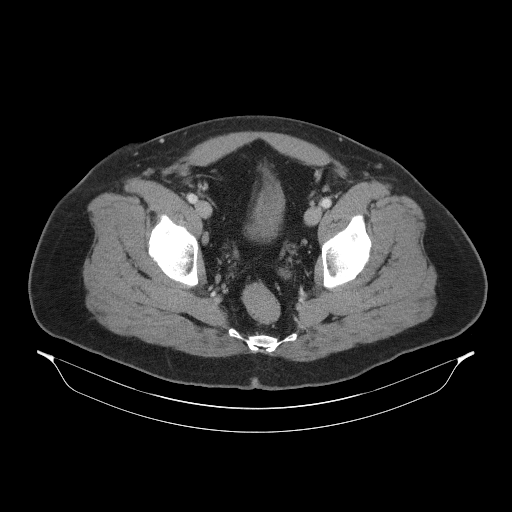
[im 35/111  soft-tissue]
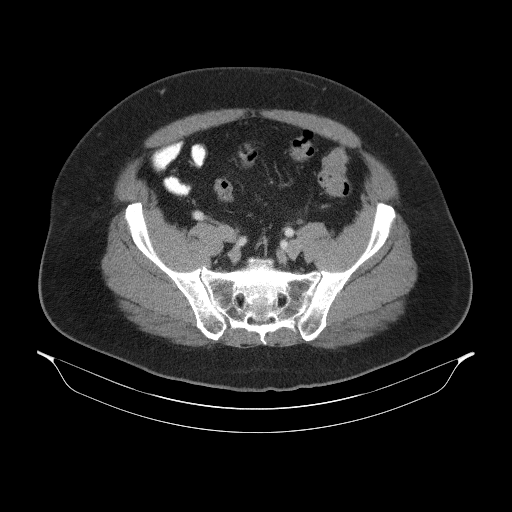
[im 41/111  soft-tissue]
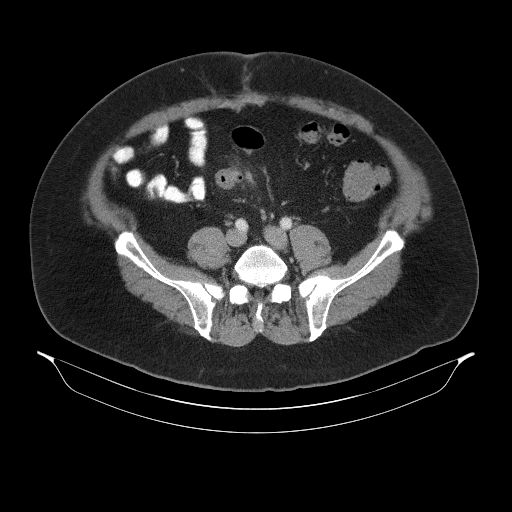
[im 53/111  soft-tissue]
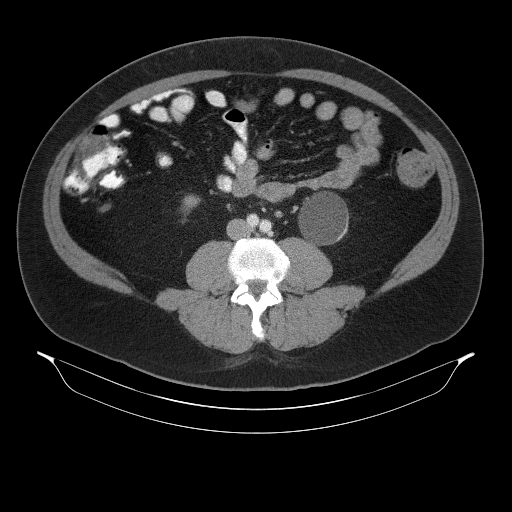
[im 58/111  soft-tissue]
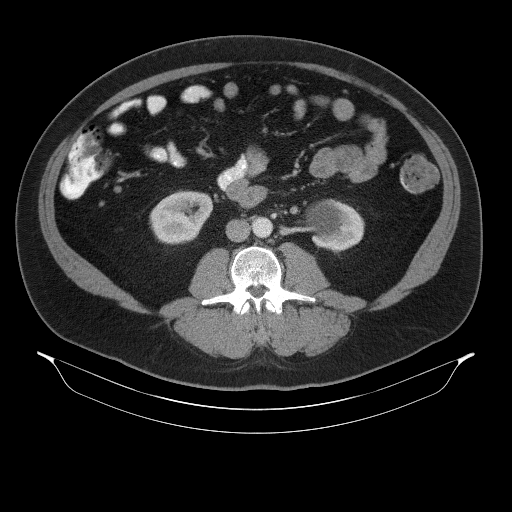
[im 70/111  soft-tissue]
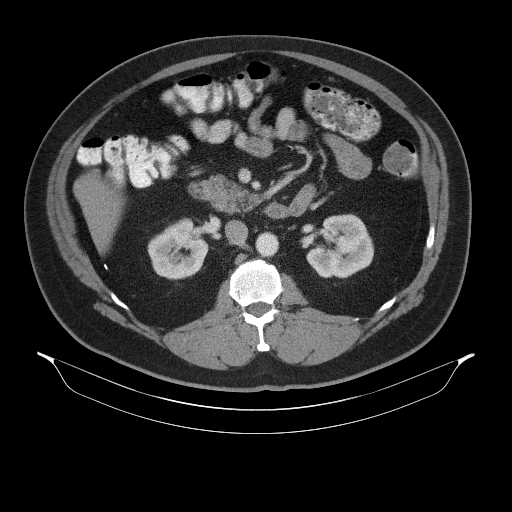
[im 76/111  soft-tissue]
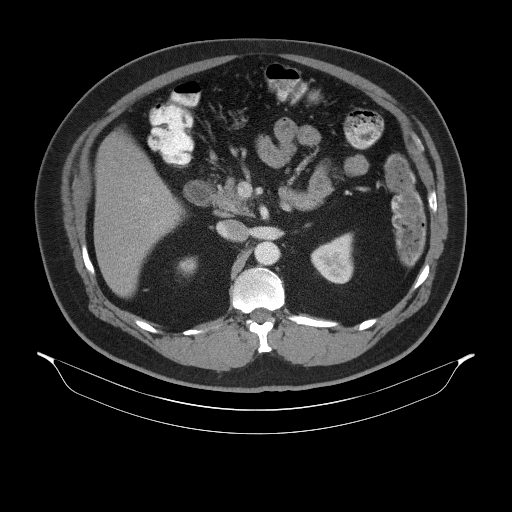
[im 76/111  bone]
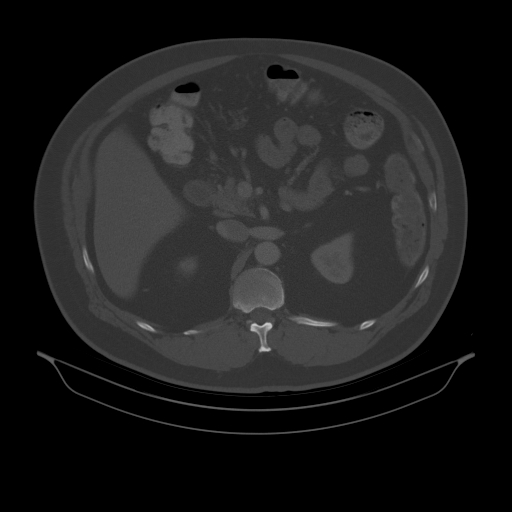
[im 87/111  soft-tissue]
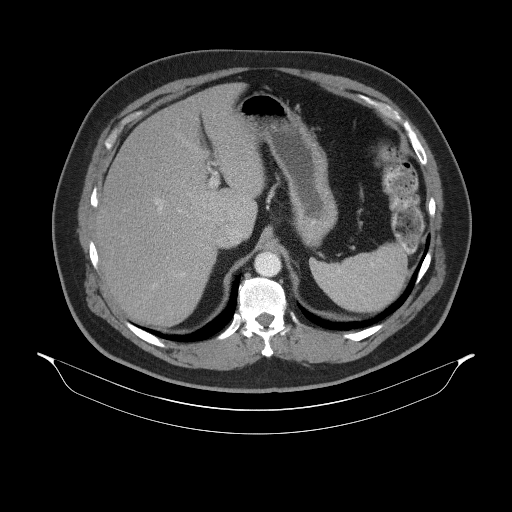
[im 87/111  lung]
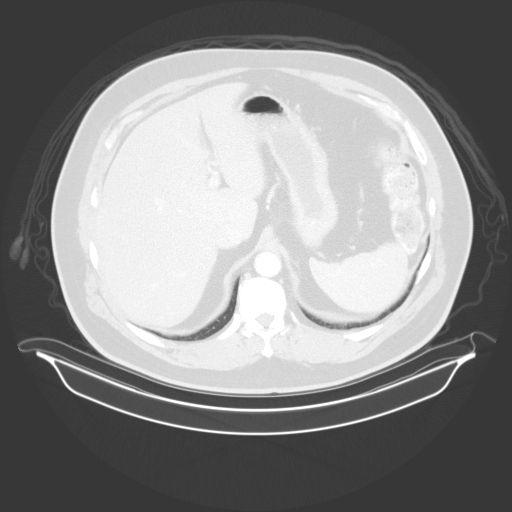
[im 93/111  soft-tissue]
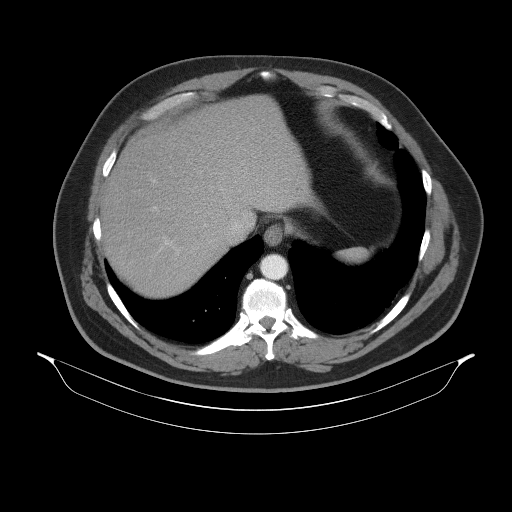
[im 93/111  lung]
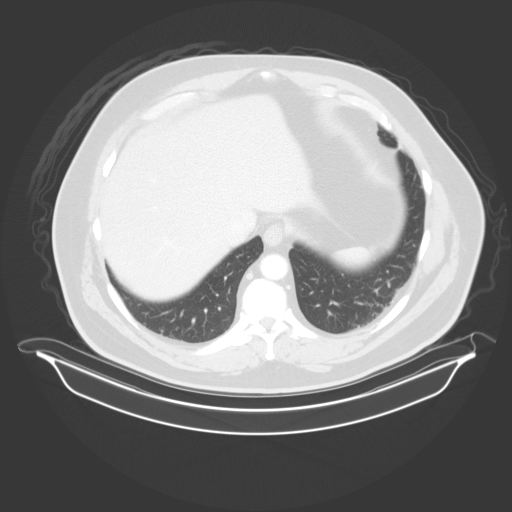
[im 99/111  lung]
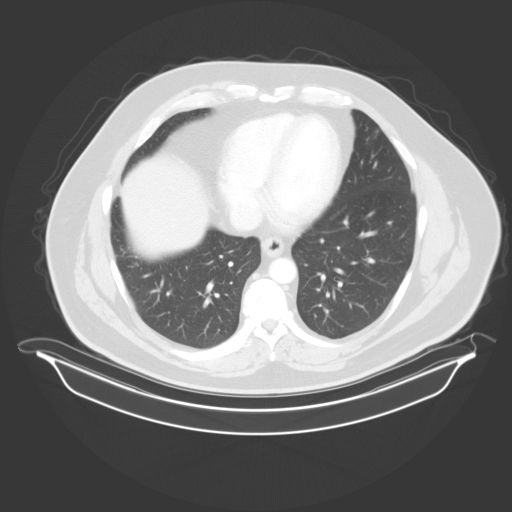
[im 105/111  soft-tissue]
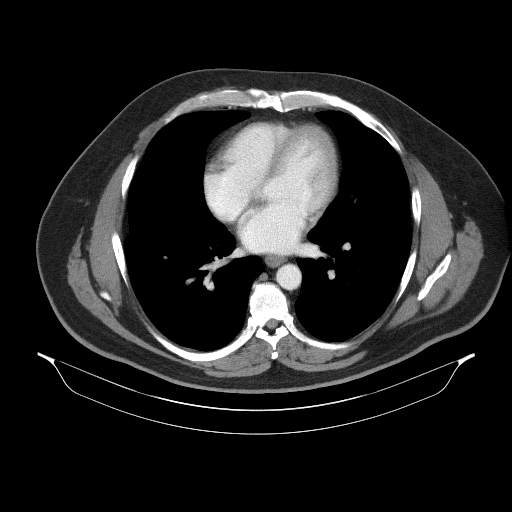
[im 105/111  lung]
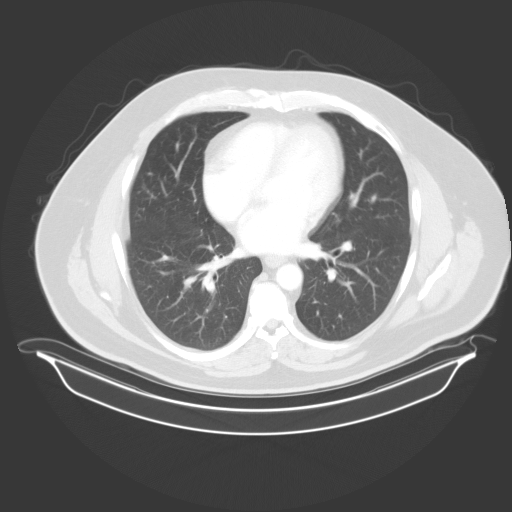

[13 of 32 positions shown; findings below may reference images not displayed]

FINDINGS: Lower chest: Clear lung bases. No significant pleural or pericardial
effusion.

Hepatobiliary: Stable in appearance without suspicious focal
abnormality. No significant biliary dilatation status post
cholecystectomy.

Pancreas: Unremarkable. No pancreatic ductal dilatation or
surrounding inflammatory changes.

Spleen: Normal in size without focal abnormality.

Adrenals/Urinary Tract: Both adrenal glands appear normal.Stable
bilateral renal cysts. No evidence of renal mass, urinary tract
calculus or hydronephrosis. The bladder demonstrates no abnormality.

Stomach/Bowel: The stomach, small bowel, appendix and proximal colon
demonstrate no significant findings.There has been significant
interval improvement in the wall thickening and surrounding
inflammation in the mid sigmoid colon. There may be a small amount
of residual extraluminal air, best seen on coronal image 68. There
is no extraluminal fluid collection. No evidence of bowel
obstruction.

Vascular/Lymphatic: There are no enlarged abdominal or pelvic lymph
nodes. No significant vascular findings are present. Accessory left
renal veins extending into the left iliac vein noted.

Reproductive: Unremarkable.

Other: Supraumbilical abdominal wall hernia contains only fat. The
defect measures 2.0 cm transverse on image 65. The herniated fat
measures up to 7 cm transverse on image 64. No other hernias
identified.

Musculoskeletal: No acute or significant osseous findings.
IMPRESSION: 1. Significant improvement in previously demonstrated acute sigmoid
diverticulitis. There may be a small amount residual extraluminal
air, but there is no evidence of abscess or obstruction.
2. Stable supraumbilical abdominal wall hernia containing fat.
3. Stable bilateral renal cysts.

## 2017-03-14 DIAGNOSIS — G4733 Obstructive sleep apnea (adult) (pediatric): Secondary | ICD-10-CM | POA: Diagnosis not present

## 2017-03-26 DIAGNOSIS — Z0000 Encounter for general adult medical examination without abnormal findings: Secondary | ICD-10-CM | POA: Diagnosis not present

## 2017-03-26 DIAGNOSIS — R7301 Impaired fasting glucose: Secondary | ICD-10-CM | POA: Diagnosis not present

## 2017-03-26 DIAGNOSIS — E063 Autoimmune thyroiditis: Secondary | ICD-10-CM | POA: Diagnosis not present

## 2017-04-02 DIAGNOSIS — Z0131 Encounter for examination of blood pressure with abnormal findings: Secondary | ICD-10-CM | POA: Diagnosis not present

## 2017-04-02 DIAGNOSIS — Z23 Encounter for immunization: Secondary | ICD-10-CM | POA: Diagnosis not present

## 2017-04-02 DIAGNOSIS — G4733 Obstructive sleep apnea (adult) (pediatric): Secondary | ICD-10-CM | POA: Diagnosis not present

## 2017-04-02 DIAGNOSIS — R7301 Impaired fasting glucose: Secondary | ICD-10-CM | POA: Diagnosis not present

## 2017-04-02 DIAGNOSIS — Z1389 Encounter for screening for other disorder: Secondary | ICD-10-CM | POA: Diagnosis not present

## 2017-04-02 DIAGNOSIS — Z0000 Encounter for general adult medical examination without abnormal findings: Secondary | ICD-10-CM | POA: Diagnosis not present

## 2017-04-02 DIAGNOSIS — E7849 Other hyperlipidemia: Secondary | ICD-10-CM | POA: Diagnosis not present

## 2017-06-19 DIAGNOSIS — E063 Autoimmune thyroiditis: Secondary | ICD-10-CM | POA: Diagnosis not present

## 2017-06-19 DIAGNOSIS — F341 Dysthymic disorder: Secondary | ICD-10-CM | POA: Diagnosis not present

## 2017-06-19 DIAGNOSIS — Z0131 Encounter for examination of blood pressure with abnormal findings: Secondary | ICD-10-CM | POA: Diagnosis not present

## 2017-06-19 DIAGNOSIS — R7301 Impaired fasting glucose: Secondary | ICD-10-CM | POA: Diagnosis not present

## 2017-10-20 DIAGNOSIS — F341 Dysthymic disorder: Secondary | ICD-10-CM | POA: Diagnosis not present

## 2017-10-20 DIAGNOSIS — R7301 Impaired fasting glucose: Secondary | ICD-10-CM | POA: Diagnosis not present

## 2017-10-20 DIAGNOSIS — E063 Autoimmune thyroiditis: Secondary | ICD-10-CM | POA: Diagnosis not present

## 2017-10-20 DIAGNOSIS — I10 Essential (primary) hypertension: Secondary | ICD-10-CM | POA: Diagnosis not present

## 2017-10-20 DIAGNOSIS — Z23 Encounter for immunization: Secondary | ICD-10-CM | POA: Diagnosis not present

## 2018-01-08 DIAGNOSIS — I10 Essential (primary) hypertension: Secondary | ICD-10-CM | POA: Diagnosis not present

## 2018-03-21 ENCOUNTER — Encounter: Payer: Self-pay | Admitting: Internal Medicine

## 2018-04-03 DIAGNOSIS — E063 Autoimmune thyroiditis: Secondary | ICD-10-CM | POA: Diagnosis not present

## 2018-04-03 DIAGNOSIS — Z0000 Encounter for general adult medical examination without abnormal findings: Secondary | ICD-10-CM | POA: Diagnosis not present

## 2018-04-03 DIAGNOSIS — R82998 Other abnormal findings in urine: Secondary | ICD-10-CM | POA: Diagnosis not present

## 2018-04-03 DIAGNOSIS — I10 Essential (primary) hypertension: Secondary | ICD-10-CM | POA: Diagnosis not present

## 2018-04-03 DIAGNOSIS — R7301 Impaired fasting glucose: Secondary | ICD-10-CM | POA: Diagnosis not present

## 2018-04-03 DIAGNOSIS — Z125 Encounter for screening for malignant neoplasm of prostate: Secondary | ICD-10-CM | POA: Diagnosis not present

## 2018-04-14 DIAGNOSIS — E7849 Other hyperlipidemia: Secondary | ICD-10-CM | POA: Diagnosis not present

## 2018-04-14 DIAGNOSIS — Z0000 Encounter for general adult medical examination without abnormal findings: Secondary | ICD-10-CM | POA: Diagnosis not present

## 2018-04-14 DIAGNOSIS — E063 Autoimmune thyroiditis: Secondary | ICD-10-CM | POA: Diagnosis not present

## 2018-04-14 DIAGNOSIS — R7301 Impaired fasting glucose: Secondary | ICD-10-CM | POA: Diagnosis not present

## 2018-04-14 DIAGNOSIS — I10 Essential (primary) hypertension: Secondary | ICD-10-CM | POA: Diagnosis not present

## 2018-04-14 DIAGNOSIS — Z1331 Encounter for screening for depression: Secondary | ICD-10-CM | POA: Diagnosis not present

## 2018-09-16 DIAGNOSIS — G4733 Obstructive sleep apnea (adult) (pediatric): Secondary | ICD-10-CM | POA: Diagnosis not present

## 2018-10-05 DIAGNOSIS — E785 Hyperlipidemia, unspecified: Secondary | ICD-10-CM | POA: Diagnosis not present

## 2018-10-05 DIAGNOSIS — I10 Essential (primary) hypertension: Secondary | ICD-10-CM | POA: Diagnosis not present

## 2018-10-05 DIAGNOSIS — G4733 Obstructive sleep apnea (adult) (pediatric): Secondary | ICD-10-CM | POA: Diagnosis not present

## 2018-10-05 DIAGNOSIS — R7301 Impaired fasting glucose: Secondary | ICD-10-CM | POA: Diagnosis not present

## 2018-10-06 DIAGNOSIS — M9903 Segmental and somatic dysfunction of lumbar region: Secondary | ICD-10-CM | POA: Diagnosis not present

## 2018-10-06 DIAGNOSIS — M5116 Intervertebral disc disorders with radiculopathy, lumbar region: Secondary | ICD-10-CM | POA: Diagnosis not present

## 2018-10-06 DIAGNOSIS — M25551 Pain in right hip: Secondary | ICD-10-CM | POA: Diagnosis not present

## 2018-10-06 DIAGNOSIS — M9905 Segmental and somatic dysfunction of pelvic region: Secondary | ICD-10-CM | POA: Diagnosis not present

## 2018-10-08 DIAGNOSIS — M5116 Intervertebral disc disorders with radiculopathy, lumbar region: Secondary | ICD-10-CM | POA: Diagnosis not present

## 2018-10-08 DIAGNOSIS — M9905 Segmental and somatic dysfunction of pelvic region: Secondary | ICD-10-CM | POA: Diagnosis not present

## 2018-10-08 DIAGNOSIS — M25551 Pain in right hip: Secondary | ICD-10-CM | POA: Diagnosis not present

## 2018-10-08 DIAGNOSIS — M9903 Segmental and somatic dysfunction of lumbar region: Secondary | ICD-10-CM | POA: Diagnosis not present

## 2018-10-12 DIAGNOSIS — M9903 Segmental and somatic dysfunction of lumbar region: Secondary | ICD-10-CM | POA: Diagnosis not present

## 2018-10-12 DIAGNOSIS — M25551 Pain in right hip: Secondary | ICD-10-CM | POA: Diagnosis not present

## 2018-10-12 DIAGNOSIS — M5116 Intervertebral disc disorders with radiculopathy, lumbar region: Secondary | ICD-10-CM | POA: Diagnosis not present

## 2018-10-12 DIAGNOSIS — M9905 Segmental and somatic dysfunction of pelvic region: Secondary | ICD-10-CM | POA: Diagnosis not present

## 2018-10-13 DIAGNOSIS — M5116 Intervertebral disc disorders with radiculopathy, lumbar region: Secondary | ICD-10-CM | POA: Diagnosis not present

## 2018-10-13 DIAGNOSIS — M9903 Segmental and somatic dysfunction of lumbar region: Secondary | ICD-10-CM | POA: Diagnosis not present

## 2018-10-13 DIAGNOSIS — M9905 Segmental and somatic dysfunction of pelvic region: Secondary | ICD-10-CM | POA: Diagnosis not present

## 2018-10-13 DIAGNOSIS — M25551 Pain in right hip: Secondary | ICD-10-CM | POA: Diagnosis not present

## 2018-10-15 DIAGNOSIS — M9903 Segmental and somatic dysfunction of lumbar region: Secondary | ICD-10-CM | POA: Diagnosis not present

## 2018-10-15 DIAGNOSIS — M9905 Segmental and somatic dysfunction of pelvic region: Secondary | ICD-10-CM | POA: Diagnosis not present

## 2018-10-15 DIAGNOSIS — M5116 Intervertebral disc disorders with radiculopathy, lumbar region: Secondary | ICD-10-CM | POA: Diagnosis not present

## 2018-10-15 DIAGNOSIS — M25551 Pain in right hip: Secondary | ICD-10-CM | POA: Diagnosis not present

## 2018-10-19 DIAGNOSIS — M5116 Intervertebral disc disorders with radiculopathy, lumbar region: Secondary | ICD-10-CM | POA: Diagnosis not present

## 2018-10-19 DIAGNOSIS — M25551 Pain in right hip: Secondary | ICD-10-CM | POA: Diagnosis not present

## 2018-10-19 DIAGNOSIS — M9903 Segmental and somatic dysfunction of lumbar region: Secondary | ICD-10-CM | POA: Diagnosis not present

## 2018-10-19 DIAGNOSIS — M9905 Segmental and somatic dysfunction of pelvic region: Secondary | ICD-10-CM | POA: Diagnosis not present

## 2018-10-20 DIAGNOSIS — M25551 Pain in right hip: Secondary | ICD-10-CM | POA: Diagnosis not present

## 2018-10-20 DIAGNOSIS — M5116 Intervertebral disc disorders with radiculopathy, lumbar region: Secondary | ICD-10-CM | POA: Diagnosis not present

## 2018-10-20 DIAGNOSIS — M9905 Segmental and somatic dysfunction of pelvic region: Secondary | ICD-10-CM | POA: Diagnosis not present

## 2018-10-20 DIAGNOSIS — M9903 Segmental and somatic dysfunction of lumbar region: Secondary | ICD-10-CM | POA: Diagnosis not present

## 2018-10-22 DIAGNOSIS — M25551 Pain in right hip: Secondary | ICD-10-CM | POA: Diagnosis not present

## 2018-10-22 DIAGNOSIS — M9905 Segmental and somatic dysfunction of pelvic region: Secondary | ICD-10-CM | POA: Diagnosis not present

## 2018-10-22 DIAGNOSIS — M5116 Intervertebral disc disorders with radiculopathy, lumbar region: Secondary | ICD-10-CM | POA: Diagnosis not present

## 2018-10-22 DIAGNOSIS — M9903 Segmental and somatic dysfunction of lumbar region: Secondary | ICD-10-CM | POA: Diagnosis not present

## 2018-10-26 DIAGNOSIS — M9903 Segmental and somatic dysfunction of lumbar region: Secondary | ICD-10-CM | POA: Diagnosis not present

## 2018-10-26 DIAGNOSIS — M9905 Segmental and somatic dysfunction of pelvic region: Secondary | ICD-10-CM | POA: Diagnosis not present

## 2018-10-26 DIAGNOSIS — M25551 Pain in right hip: Secondary | ICD-10-CM | POA: Diagnosis not present

## 2018-10-26 DIAGNOSIS — M5116 Intervertebral disc disorders with radiculopathy, lumbar region: Secondary | ICD-10-CM | POA: Diagnosis not present

## 2018-10-27 DIAGNOSIS — M9905 Segmental and somatic dysfunction of pelvic region: Secondary | ICD-10-CM | POA: Diagnosis not present

## 2018-10-27 DIAGNOSIS — M9903 Segmental and somatic dysfunction of lumbar region: Secondary | ICD-10-CM | POA: Diagnosis not present

## 2018-10-27 DIAGNOSIS — M25551 Pain in right hip: Secondary | ICD-10-CM | POA: Diagnosis not present

## 2018-10-27 DIAGNOSIS — M5116 Intervertebral disc disorders with radiculopathy, lumbar region: Secondary | ICD-10-CM | POA: Diagnosis not present

## 2018-10-29 DIAGNOSIS — M9905 Segmental and somatic dysfunction of pelvic region: Secondary | ICD-10-CM | POA: Diagnosis not present

## 2018-10-29 DIAGNOSIS — M25551 Pain in right hip: Secondary | ICD-10-CM | POA: Diagnosis not present

## 2018-10-29 DIAGNOSIS — M9903 Segmental and somatic dysfunction of lumbar region: Secondary | ICD-10-CM | POA: Diagnosis not present

## 2018-10-29 DIAGNOSIS — M5116 Intervertebral disc disorders with radiculopathy, lumbar region: Secondary | ICD-10-CM | POA: Diagnosis not present

## 2018-11-02 DIAGNOSIS — M9903 Segmental and somatic dysfunction of lumbar region: Secondary | ICD-10-CM | POA: Diagnosis not present

## 2018-11-02 DIAGNOSIS — M5116 Intervertebral disc disorders with radiculopathy, lumbar region: Secondary | ICD-10-CM | POA: Diagnosis not present

## 2018-11-02 DIAGNOSIS — M25551 Pain in right hip: Secondary | ICD-10-CM | POA: Diagnosis not present

## 2018-11-02 DIAGNOSIS — M9905 Segmental and somatic dysfunction of pelvic region: Secondary | ICD-10-CM | POA: Diagnosis not present

## 2018-11-04 DIAGNOSIS — M9905 Segmental and somatic dysfunction of pelvic region: Secondary | ICD-10-CM | POA: Diagnosis not present

## 2018-11-04 DIAGNOSIS — M9903 Segmental and somatic dysfunction of lumbar region: Secondary | ICD-10-CM | POA: Diagnosis not present

## 2018-11-04 DIAGNOSIS — M25551 Pain in right hip: Secondary | ICD-10-CM | POA: Diagnosis not present

## 2018-11-04 DIAGNOSIS — M5116 Intervertebral disc disorders with radiculopathy, lumbar region: Secondary | ICD-10-CM | POA: Diagnosis not present

## 2018-11-09 DIAGNOSIS — M25551 Pain in right hip: Secondary | ICD-10-CM | POA: Diagnosis not present

## 2018-11-09 DIAGNOSIS — M5116 Intervertebral disc disorders with radiculopathy, lumbar region: Secondary | ICD-10-CM | POA: Diagnosis not present

## 2018-11-09 DIAGNOSIS — M9905 Segmental and somatic dysfunction of pelvic region: Secondary | ICD-10-CM | POA: Diagnosis not present

## 2018-11-09 DIAGNOSIS — M9903 Segmental and somatic dysfunction of lumbar region: Secondary | ICD-10-CM | POA: Diagnosis not present

## 2018-11-10 DIAGNOSIS — M9905 Segmental and somatic dysfunction of pelvic region: Secondary | ICD-10-CM | POA: Diagnosis not present

## 2018-11-10 DIAGNOSIS — M25551 Pain in right hip: Secondary | ICD-10-CM | POA: Diagnosis not present

## 2018-11-10 DIAGNOSIS — M5116 Intervertebral disc disorders with radiculopathy, lumbar region: Secondary | ICD-10-CM | POA: Diagnosis not present

## 2018-11-10 DIAGNOSIS — M9903 Segmental and somatic dysfunction of lumbar region: Secondary | ICD-10-CM | POA: Diagnosis not present

## 2018-11-16 DIAGNOSIS — M25551 Pain in right hip: Secondary | ICD-10-CM | POA: Diagnosis not present

## 2018-11-16 DIAGNOSIS — M9903 Segmental and somatic dysfunction of lumbar region: Secondary | ICD-10-CM | POA: Diagnosis not present

## 2018-11-16 DIAGNOSIS — M9905 Segmental and somatic dysfunction of pelvic region: Secondary | ICD-10-CM | POA: Diagnosis not present

## 2018-11-16 DIAGNOSIS — M5116 Intervertebral disc disorders with radiculopathy, lumbar region: Secondary | ICD-10-CM | POA: Diagnosis not present

## 2018-11-19 DIAGNOSIS — M25551 Pain in right hip: Secondary | ICD-10-CM | POA: Diagnosis not present

## 2018-11-19 DIAGNOSIS — M9905 Segmental and somatic dysfunction of pelvic region: Secondary | ICD-10-CM | POA: Diagnosis not present

## 2018-11-19 DIAGNOSIS — M9903 Segmental and somatic dysfunction of lumbar region: Secondary | ICD-10-CM | POA: Diagnosis not present

## 2018-11-19 DIAGNOSIS — M5116 Intervertebral disc disorders with radiculopathy, lumbar region: Secondary | ICD-10-CM | POA: Diagnosis not present

## 2018-12-18 DIAGNOSIS — G4733 Obstructive sleep apnea (adult) (pediatric): Secondary | ICD-10-CM | POA: Diagnosis not present

## 2019-01-12 ENCOUNTER — Ambulatory Visit: Payer: HRSA Program | Attending: Internal Medicine

## 2019-01-12 DIAGNOSIS — U071 COVID-19: Secondary | ICD-10-CM

## 2019-01-12 DIAGNOSIS — Z20828 Contact with and (suspected) exposure to other viral communicable diseases: Secondary | ICD-10-CM | POA: Diagnosis present

## 2019-01-12 DIAGNOSIS — R238 Other skin changes: Secondary | ICD-10-CM | POA: Insufficient documentation

## 2019-01-14 LAB — NOVEL CORONAVIRUS, NAA: SARS-CoV-2, NAA: NOT DETECTED

## 2019-06-22 DIAGNOSIS — G4733 Obstructive sleep apnea (adult) (pediatric): Secondary | ICD-10-CM | POA: Diagnosis not present

## 2019-07-29 DIAGNOSIS — M25512 Pain in left shoulder: Secondary | ICD-10-CM | POA: Diagnosis not present

## 2019-08-03 DIAGNOSIS — Z0000 Encounter for general adult medical examination without abnormal findings: Secondary | ICD-10-CM | POA: Diagnosis not present

## 2019-08-03 DIAGNOSIS — R7301 Impaired fasting glucose: Secondary | ICD-10-CM | POA: Diagnosis not present

## 2019-08-03 DIAGNOSIS — E7849 Other hyperlipidemia: Secondary | ICD-10-CM | POA: Diagnosis not present

## 2019-08-03 DIAGNOSIS — Z125 Encounter for screening for malignant neoplasm of prostate: Secondary | ICD-10-CM | POA: Diagnosis not present

## 2019-08-09 DIAGNOSIS — G4733 Obstructive sleep apnea (adult) (pediatric): Secondary | ICD-10-CM | POA: Diagnosis not present

## 2019-08-09 DIAGNOSIS — R7301 Impaired fasting glucose: Secondary | ICD-10-CM | POA: Diagnosis not present

## 2019-08-09 DIAGNOSIS — E785 Hyperlipidemia, unspecified: Secondary | ICD-10-CM | POA: Diagnosis not present

## 2019-08-09 DIAGNOSIS — Z1331 Encounter for screening for depression: Secondary | ICD-10-CM | POA: Diagnosis not present

## 2019-08-09 DIAGNOSIS — Z0000 Encounter for general adult medical examination without abnormal findings: Secondary | ICD-10-CM | POA: Diagnosis not present

## 2019-08-09 DIAGNOSIS — I10 Essential (primary) hypertension: Secondary | ICD-10-CM | POA: Diagnosis not present

## 2019-09-22 DIAGNOSIS — G4733 Obstructive sleep apnea (adult) (pediatric): Secondary | ICD-10-CM | POA: Diagnosis not present

## 2019-11-17 DIAGNOSIS — Z23 Encounter for immunization: Secondary | ICD-10-CM | POA: Diagnosis not present

## 2019-12-14 DIAGNOSIS — D2239 Melanocytic nevi of other parts of face: Secondary | ICD-10-CM | POA: Diagnosis not present

## 2019-12-14 DIAGNOSIS — L821 Other seborrheic keratosis: Secondary | ICD-10-CM | POA: Diagnosis not present

## 2019-12-22 DIAGNOSIS — G4733 Obstructive sleep apnea (adult) (pediatric): Secondary | ICD-10-CM | POA: Diagnosis not present

## 2020-03-21 DIAGNOSIS — G4733 Obstructive sleep apnea (adult) (pediatric): Secondary | ICD-10-CM | POA: Diagnosis not present

## 2020-03-23 DIAGNOSIS — H524 Presbyopia: Secondary | ICD-10-CM | POA: Diagnosis not present

## 2020-03-23 DIAGNOSIS — H40053 Ocular hypertension, bilateral: Secondary | ICD-10-CM | POA: Diagnosis not present

## 2020-03-23 DIAGNOSIS — H52203 Unspecified astigmatism, bilateral: Secondary | ICD-10-CM | POA: Diagnosis not present

## 2020-05-01 DIAGNOSIS — Z1159 Encounter for screening for other viral diseases: Secondary | ICD-10-CM | POA: Diagnosis not present

## 2020-06-21 DIAGNOSIS — G4733 Obstructive sleep apnea (adult) (pediatric): Secondary | ICD-10-CM | POA: Diagnosis not present

## 2020-08-03 DIAGNOSIS — R197 Diarrhea, unspecified: Secondary | ICD-10-CM | POA: Diagnosis not present

## 2020-08-14 ENCOUNTER — Encounter: Payer: Self-pay | Admitting: Internal Medicine

## 2020-09-19 ENCOUNTER — Encounter: Payer: Self-pay | Admitting: Internal Medicine

## 2020-09-19 ENCOUNTER — Ambulatory Visit (INDEPENDENT_AMBULATORY_CARE_PROVIDER_SITE_OTHER): Payer: BC Managed Care – PPO | Admitting: Internal Medicine

## 2020-09-19 VITALS — BP 140/90 | HR 66 | Ht 70.5 in | Wt 303.0 lb

## 2020-09-19 DIAGNOSIS — A0472 Enterocolitis due to Clostridium difficile, not specified as recurrent: Secondary | ICD-10-CM | POA: Diagnosis not present

## 2020-09-19 DIAGNOSIS — Z860100 Personal history of colon polyps, unspecified: Secondary | ICD-10-CM

## 2020-09-19 DIAGNOSIS — K5732 Diverticulitis of large intestine without perforation or abscess without bleeding: Secondary | ICD-10-CM

## 2020-09-19 MED ORDER — SUTAB 1479-225-188 MG PO TABS: 1.0000 | ORAL_TABLET | Freq: Once | ORAL | 0 refills | Status: AC

## 2020-09-19 NOTE — Progress Notes (Signed)
HISTORY OF PRESENT ILLNESS:  Joshua Mcdonald is a 55 y.o. male, owner of a 1 restaurant, with morbid obesity, recurrent diverticulitis, and multiple adenomatous colon polyps who presents today regarding recurrent C. difficile colitis and the need for surveillance colonoscopy.  I last saw the patient March 08, 2015 when he underwent routine screening colonoscopy.  He was found to have 5 colon polyps which were adenomatous, sessile serrated, and inflammatory.  Follow-up in 3 years recommended.  He tells me that in June 2022 he developed diverticulitis.  He was treated with ciprofloxacin and metronidazole.  Around July 24, 2020 he developed problems with diarrhea.  This persisted.  Testing for C. difficile was positive.  He was treated with metronidazole.  Diarrhea improved.  However, he relapsed in 3 days.  He was subsequently treated with a 3 to 4-week course of vancomycin 125 mg 4 times daily.  He has had no diarrhea for 2 weeks.  He has been off medical therapy for 4 days.  He is feeling well.  He describes 2 formed bowel movements per day which is his baseline.  Outside stool testing from August 03, 2020 shows positive C. difficile.  REVIEW OF SYSTEMS:  All non-GI ROS negative unless otherwise stated in the HPI.  Past Medical History:  Diagnosis Date   Depression    Diverticulitis    Hyperlipidemia    Hypothyroidism    Obesity, Class III, BMI 40-49.9 (morbid obesity) ([place])    BMI 40   OSA (obstructive sleep apnea)     Past Surgical History:  Procedure Laterality Date   CHOLECYSTECTOMY  2012   NASAL SEPTUM SURGERY  [id number]   UMBILICAL HERNIA REPAIR  2003    Social History Delancey Talamo  reports that he has never smoked. He has quit using smokeless tobacco. He reports current alcohol use. He reports that he does not use drugs.  family history includes Breast cancer in his paternal grandmother; Cancer in his maternal grandmother; Heart disease in his maternal grandmother; Liver cancer in  his paternal grandfather; Lung cancer in his maternal grandfather.  No Known Allergies     PHYSICAL EXAMINATION: Vital signs: BP 140/90   Pulse 66   Ht 5' 10.5" (1.791 m)   Wt (!) 303 lb (137.4 kg)   SpO2 97%   BMI 42.86 kg/m   Constitutional: generally well-appearing, no acute distress Psychiatric: alert and oriented x3, cooperative Eyes: extraocular movements intact, anicteric, conjunctiva pink Mouth: oral pharynx moist, no lesions Neck: supple no lymphadenopathy Cardiovascular: heart regular rate and rhythm, no murmur Lungs: clear to auscultation bilaterally Abdomen: soft, obese, nontender, nondistended, no obvious ascites, no peritoneal signs, normal bowel sounds, no organomegaly Rectal: Deferred until colonoscopy Extremities: no clubbing, cyanosis, or lower extremity edema bilaterally Skin: no lesions on visible extremities Neuro: No focal deficits.  Cranial nerves intact  ASSESSMENT:  1.  History of multiple colon polyps (adenoma and sessile serrated) February 2017.  Overdue for surveillance 2.  Recurrent diverticulitis.  Successfully treated 3.  Recurrent C. difficile colitis.  Successfully treated 4.  Morbid obesity   PLAN:  1.  Long discussion today on C. difficile, its etiology, its treatment, relapse rates, and the role of prophylaxis. 2.  High-fiber diet 3.  Schedule surveillance colonoscopy.  Patient is high risk given his body habitus.The nature of the procedure, as well as the risks, benefits, and alternatives were carefully and thoroughly reviewed with the patient. Ample time for discussion and questions allowed. The patient understood, was satisfied, and agreed  to proceed.

## 2020-09-19 NOTE — Patient Instructions (Signed)
If you are age 56 or older, your body mass index should be between 23-30. Your Body mass index is 42.86 kg/m. If this is out of the aforementioned range listed, please consider follow up with your Primary Care Provider.  If you are age 15 or younger, your body mass index should be between 19-25. Your Body mass index is 42.86 kg/m. If this is out of the aformentioned range listed, please consider follow up with your Primary Care Provider.   __________________________________________________________  The [facility] providers would like to encourage you to use [facility] to communicate with providers for non-urgent requests or questions.  Due to long hold times on the telephone, sending your provider a message by [facility] may be a faster and more efficient way to get a response.  Please allow 48 business hours for a response.  Please remember that this is for non-urgent requests.   You have been scheduled for a colonoscopy. Please follow written instructions given to you at your visit today.  Please pick up your prep supplies at the pharmacy within the next 1-3 days. If you use inhalers (even only as needed), please bring them with you on the day of your procedure.

## 2020-09-21 DIAGNOSIS — Z125 Encounter for screening for malignant neoplasm of prostate: Secondary | ICD-10-CM | POA: Diagnosis not present

## 2020-09-21 DIAGNOSIS — R7301 Impaired fasting glucose: Secondary | ICD-10-CM | POA: Diagnosis not present

## 2020-09-21 DIAGNOSIS — E785 Hyperlipidemia, unspecified: Secondary | ICD-10-CM | POA: Diagnosis not present

## 2020-09-28 DIAGNOSIS — R7301 Impaired fasting glucose: Secondary | ICD-10-CM | POA: Diagnosis not present

## 2020-09-28 DIAGNOSIS — Z0000 Encounter for general adult medical examination without abnormal findings: Secondary | ICD-10-CM | POA: Diagnosis not present

## 2020-12-01 ENCOUNTER — Encounter: Payer: Self-pay | Admitting: Internal Medicine

## 2020-12-08 ENCOUNTER — Encounter: Payer: BC Managed Care – PPO | Admitting: Internal Medicine

## 2021-02-08 DIAGNOSIS — R7301 Impaired fasting glucose: Secondary | ICD-10-CM | POA: Diagnosis not present

## 2021-02-08 DIAGNOSIS — I10 Essential (primary) hypertension: Secondary | ICD-10-CM | POA: Diagnosis not present

## 2021-02-16 ENCOUNTER — Other Ambulatory Visit: Payer: Self-pay

## 2021-02-16 ENCOUNTER — Ambulatory Visit (AMBULATORY_SURGERY_CENTER): Payer: BC Managed Care – PPO | Admitting: *Deleted

## 2021-02-16 VITALS — Ht 70.5 in | Wt 308.0 lb

## 2021-02-16 DIAGNOSIS — Z86010 Personal history of colonic polyps: Secondary | ICD-10-CM

## 2021-02-16 MED ORDER — NA SULFATE-K SULFATE-MG SULF 17.5-3.13-1.6 GM/177ML PO SOLN
1.0000 | Freq: Once | ORAL | 0 refills | Status: AC
Start: 2021-02-16 — End: 2021-02-16

## 2021-02-16 NOTE — Progress Notes (Signed)

## 2021-02-21 ENCOUNTER — Encounter: Payer: Self-pay | Admitting: Internal Medicine

## 2021-02-23 ENCOUNTER — Ambulatory Visit (AMBULATORY_SURGERY_CENTER): Payer: BC Managed Care – PPO | Admitting: Internal Medicine

## 2021-02-23 ENCOUNTER — Encounter: Payer: Self-pay | Admitting: Internal Medicine

## 2021-02-23 ENCOUNTER — Other Ambulatory Visit: Payer: Self-pay

## 2021-02-23 VITALS — BP 119/70 | HR 63 | Temp 99.6°F | Resp 14 | Ht 70.5 in | Wt 308.0 lb

## 2021-02-23 DIAGNOSIS — Z86010 Personal history of colonic polyps: Secondary | ICD-10-CM | POA: Diagnosis not present

## 2021-02-23 DIAGNOSIS — D123 Benign neoplasm of transverse colon: Secondary | ICD-10-CM | POA: Diagnosis not present

## 2021-02-23 DIAGNOSIS — Z1211 Encounter for screening for malignant neoplasm of colon: Secondary | ICD-10-CM | POA: Diagnosis not present

## 2021-02-23 MED ORDER — SODIUM CHLORIDE 0.9 % IV SOLN
500.0000 mL | Freq: Once | INTRAVENOUS | Status: DC
Start: 2021-02-23 — End: 2021-02-23

## 2021-02-23 NOTE — Progress Notes (Signed)
HISTORY OF PRESENT ILLNESS:  Joshua Mcdonald is a 56 y.o. male with a history of multiple polyps (adenomatous and sessile serrated) presents today for surveillance colonoscopy.  Interval change in size office visit last August.  REVIEW OF SYSTEMS:  All non-GI ROS negative. Past Medical History:  Diagnosis Date   Allergy    seasonal   Depression    Diverticulitis    GERD (gastroesophageal reflux disease)    Hyperlipidemia    Hypertension    Hypothyroidism    Obesity, Class III, BMI 40-49.9 (morbid obesity) ([place])    BMI 40   OSA (obstructive sleep apnea)    wear c pap   Sleep apnea     Past Surgical History:  Procedure Laterality Date   CHOLECYSTECTOMY  01/21/2010   COLONOSCOPY     NASAL SEPTUM SURGERY  01/22/2000   POLYPECTOMY     UMBILICAL HERNIA REPAIR  01/21/2001    Social History Joshua Mcdonald  reports that he has never smoked. He has quit using smokeless tobacco. He reports current alcohol use. He reports that he does not use drugs.  family history includes Breast cancer in his paternal grandmother; Cancer in his maternal grandmother; Heart disease in his maternal grandmother; Liver cancer in his paternal grandfather; Lung cancer in his maternal grandfather.  No Known Allergies     PHYSICAL EXAMINATION:  Vital signs: BP 132/70    Pulse 74    Temp 99.6 F (37.6 C)    Resp 12    Ht 5' 10.5" (1.791 m)    Wt (!) 308 lb (139.7 kg)    SpO2 100%    BMI 43.57 kg/m  General: Well-developed, well-nourished, no acute distress HEENT: Sclerae are anicteric, conjunctiva pink. Oral mucosa intact Lungs: Clear Heart: Regular Abdomen: soft, nontender, nondistended, no obvious ascites, no peritoneal signs, normal bowel sounds. No organomegaly. Extremities: No edema Psychiatric: alert and oriented x3. Cooperative     ASSESSMENT:  1.  History of multiple colon polyps   PLAN:   1.  Surveillance colonoscopy

## 2021-02-23 NOTE — Progress Notes (Signed)
A and O x3. Report to RN. Tolerated MAC anesthesia well. 

## 2021-02-23 NOTE — Progress Notes (Signed)
Pt's states no medical or surgical changes since previsit or office visit. 

## 2021-02-23 NOTE — Patient Instructions (Signed)
Please read handouts provided. Continue present medications. Await pathology results.   YOU HAD AN ENDOSCOPIC PROCEDURE TODAY AT THE [facility]:   Refer to the procedure report that was given to you for any specific questions about what was found during the examination.  If the procedure report does not answer your questions, please call your gastroenterologist to clarify.  If you requested that your care partner not be given the details of your procedure findings, then the procedure report has been included in a sealed envelope for you to review at your convenience later.  YOU SHOULD EXPECT: Some feelings of bloating in the abdomen. Passage of more gas than usual.  Walking can help get rid of the air that was put into your GI tract during the procedure and reduce the bloating. If you had a lower endoscopy (such as a colonoscopy or flexible sigmoidoscopy) you may notice spotting of blood in your stool or on the toilet paper. If you underwent a bowel prep for your procedure, you may not have a normal bowel movement for a few days.  Please Note:  You might notice some irritation and congestion in your nose or some drainage.  This is from the oxygen used during your procedure.  There is no need for concern and it should clear up in a day or so.  SYMPTOMS TO REPORT IMMEDIATELY:  Following lower endoscopy (colonoscopy or flexible sigmoidoscopy):  Excessive amounts of blood in the stool  Significant tenderness or worsening of abdominal pains  Swelling of the abdomen that is new, acute  Fever of 100F or higher   For urgent or emergent issues, a gastroenterologist can be reached at any hour by calling [phone]. Do not use MyChart messaging for urgent concerns.    DIET:  We do recommend a small meal at first, but then you may proceed to your regular diet.  Drink plenty of fluids but you should avoid alcoholic beverages for 24 hours.  ACTIVITY:  You should plan to take it easy  for the rest of today and you should NOT DRIVE or use heavy machinery until tomorrow (because of the sedation medicines used during the test).    FOLLOW UP: Our staff will call the number listed on your records 48-72 hours following your procedure to check on you and address any questions or concerns that you may have regarding the information given to you following your procedure. If we do not reach you, we will leave a message.  We will attempt to reach you two times.  During this call, we will ask if you have developed any symptoms of COVID 19. If you develop any symptoms (ie: fever, flu-like symptoms, shortness of breath, cough etc.) before then, please call [phone].  If you test positive for Covid 19 in the 2 weeks post procedure, please call and report this information to us.    If any biopsies were taken you will be contacted by phone or by letter within the next 1-3 weeks.  Please call us at [phone] if you have not heard about the biopsies in 3 weeks.    SIGNATURES/CONFIDENTIALITY: You and/or your care partner have signed paperwork which will be entered into your electronic medical record.  These signatures attest to the fact that that the information above on your After Visit Summary has been reviewed and is understood.  Full responsibility of the confidentiality of this discharge information lies with you and/or your care-partner.  

## 2021-02-23 NOTE — Op Note (Signed)
[facility] Patient Name: Joshua Mcdonald Procedure Date: 02/23/2021 10:08 AM MRN: [id number] Endoscopist: Docia Chuck. Henrene Pastor , MD Age: 56 Referring MD:  Date of Birth: 04/12/1965 Gender: Male Account #: [id number] Procedure:                Colonoscopy with cold snare polypectomy x 1 Indications:              High risk colon cancer surveillance: Personal                            history of non-advanced adenoma, High risk colon                            cancer surveillance: Personal history of sessile                            serrated colon polyp (less than 10 mm in size) with                            no dysplasia. Previous examination 2017 Medicines:                Monitored Anesthesia Care Procedure:                Pre-Anesthesia Assessment:                           - Prior to the procedure, a History and Physical                            was performed, and patient medications and                            allergies were reviewed. The patient's tolerance of                            previous anesthesia was also reviewed. The risks                            and benefits of the procedure and the sedation                            options and risks were discussed with the patient.                            All questions were answered, and informed consent                            was obtained. Prior Anticoagulants: The patient has                            taken no previous anticoagulant or antiplatelet                            agents. ASA Grade Assessment: II - A patient with  mild systemic disease. After reviewing the risks                            and benefits, the patient was deemed in                            satisfactory condition to undergo the procedure.                           After obtaining informed consent, the colonoscope                            was passed under direct vision. Throughout the                             procedure, the patient's blood pressure, pulse, and                            oxygen saturations were monitored continuously. The                            Olympus CF-HQ190L [id number]) Colonoscope was                            introduced through the anus and advanced to the the                            cecum, identified by appendiceal orifice and                            ileocecal valve. The ileocecal valve, appendiceal                            orifice, and rectum were photographed. The quality                            of the bowel preparation was excellent. The                            colonoscopy was performed without difficulty. The                            patient tolerated the procedure well. The bowel                            preparation used was Plenvu via split dose                            instruction. Scope In: 10:18:41 AM Scope Out: 10:37:03 AM Scope Withdrawal Time: 0 hours 9 minutes 33 seconds  Total Procedure Duration: 0 hours 18 minutes 22 seconds  Findings:                 A 2 mm polyp was found in the transverse colon. The  polyp was sessile. The polyp was removed with a                            cold snare. Resection and retrieval were complete.                           Multiple diverticula were found in the sigmoid                            colon and ascending colon.                           The exam was otherwise without abnormality on                            direct and retroflexion views. Complications:            No immediate complications. Estimated blood loss:                            None. Estimated Blood Loss:     Estimated blood loss: none. Impression:               - One 2 mm polyp in the transverse colon, removed                            with a cold snare. Resected and retrieved.                           - Diverticulosis in the sigmoid colon and in the                            ascending colon.                            - The examination was otherwise normal on direct                            and retroflexion views. Recommendation:           - Repeat colonoscopy in 5 5 years if polyp                            adenomatous otherwise 10 years for surveillance.                           - Patient has a contact number available for                            emergencies. The signs and symptoms of potential                            delayed complications were discussed with the                            patient. Return to  normal activities tomorrow.                            Written discharge instructions were provided to the                            patient.                           - Resume previous diet.                           - Continue present medications.                           - Await pathology results. Docia Chuck. Henrene Pastor, MD 02/23/2021 10:53:24 AM This report has been signed electronically.

## 2021-02-27 ENCOUNTER — Telehealth: Payer: Self-pay | Admitting: *Deleted

## 2021-02-27 NOTE — Telephone Encounter (Signed)
No answer on first attempt follow up call. Left message.  ?

## 2021-02-27 NOTE — Telephone Encounter (Signed)
°  Follow up Call-  Call back number 02/23/2021  Post procedure Call Back phone  # [phone]  Permission to leave phone message Yes  Some recent data might be hidden     Patient questions:  Do you have a fever, pain , or abdominal swelling? No. Pain Score  0 *  Have you tolerated food without any problems? Yes.    Have you been able to return to your normal activities? Yes.    Do you have any questions about your discharge instructions: Diet   No. Medications  No. Follow up visit  No.  Do you have questions or concerns about your Care? No.  Actions: * If pain score is 4 or above: No action needed, pain <4.  Have you developed a fever since your procedure? no  2.   Have you had an respiratory symptoms (SOB or cough) since your procedure? no  3.   Have you tested positive for COVID 19 since your procedure no  4.   Have you had any family members/close contacts diagnosed with the COVID 19 since your procedure?  no   If yes to any of these questions please route to Joylene John, RN and Joella Prince, RN

## 2021-02-28 ENCOUNTER — Encounter: Payer: Self-pay | Admitting: Internal Medicine

## 2021-04-10 DIAGNOSIS — H524 Presbyopia: Secondary | ICD-10-CM | POA: Diagnosis not present

## 2021-04-10 DIAGNOSIS — H5203 Hypermetropia, bilateral: Secondary | ICD-10-CM | POA: Diagnosis not present

## 2021-04-10 DIAGNOSIS — H11002 Unspecified pterygium of left eye: Secondary | ICD-10-CM | POA: Diagnosis not present

## 2021-06-14 DIAGNOSIS — R7301 Impaired fasting glucose: Secondary | ICD-10-CM | POA: Diagnosis not present

## 2021-06-14 DIAGNOSIS — I10 Essential (primary) hypertension: Secondary | ICD-10-CM | POA: Diagnosis not present

## 2021-10-17 ENCOUNTER — Encounter: Payer: Self-pay | Admitting: Pulmonary Disease

## 2021-10-17 ENCOUNTER — Ambulatory Visit (INDEPENDENT_AMBULATORY_CARE_PROVIDER_SITE_OTHER): Payer: BC Managed Care – PPO | Admitting: Pulmonary Disease

## 2021-10-17 VITALS — BP 150/90 | HR 73 | Ht 71.0 in | Wt 316.2 lb

## 2021-10-17 DIAGNOSIS — G4733 Obstructive sleep apnea (adult) (pediatric): Secondary | ICD-10-CM

## 2021-10-17 DIAGNOSIS — G4730 Sleep apnea, unspecified: Secondary | ICD-10-CM

## 2021-10-17 DIAGNOSIS — E669 Obesity, unspecified: Secondary | ICD-10-CM | POA: Diagnosis not present

## 2021-10-17 NOTE — Patient Instructions (Signed)
Will have [facility] arrange for a replacement auto CPAP machine  Follow up in 4 months

## 2021-10-17 NOTE — Progress Notes (Signed)
[facility]  Chief Complaint  Patient presents with   Consult    CPAP compliance DME: [facility]    Past Surgical History:  He  has a past surgical history that includes Nasal septum surgery (01/22/2000); Umbilical hernia repair (01/21/2001); Cholecystectomy (01/21/2010); Colonoscopy; and Polypectomy.  Past Medical History:  Allergies, Depression, Diverticulitis, GERD, HLD, HTN, Hypothyroidism  Constitutional:  BP (!) 150/90 (BP Location: Right Arm) Comment: Nervous  Pulse 73   Ht '5\' 11"'$  (1.803 m)   Wt (!) 316 lb 3.2 oz (143.4 kg)   SpO2 97%   BMI 44.10 kg/m   Brief Summary:  Joshua Mcdonald is a 56 y.o. male with obstructive sleep apnea.      Subjective:   He was seen previously by Dr. Gwenette Greet.    His sleep study from 2005 showed severe sleep apnea.  His most recent CPAP machine was ordered in 2014.  He uses [facility].  His CPAP shows error report that motor life has expired.  He uses CPAP nightly, but doesn't feel as rested as before.  He doesn't have any difficulty with his mask fit.  He goes to sleep at 10 to 11 pm.  He falls asleep in 20 to 40 minutes.  He wakes up some times to use the bathroom.  He gets out of bed between 7 and 8 am.  He feels tired in the morning.  He denies morning headache.  He does not use anything to help him stay awake.  He takes melatonin around 9 pm.  He denies sleep walking, sleep talking, bruxism, or nightmares.  There is no history of restless legs.  He denies sleep hallucinations, sleep paralysis, or cataplexy.  The Epworth score is 2 out of 24.   Physical Exam:   Appearance - well kempt   ENMT - no sinus tenderness, no oral exudate, no LAN, Mallampati 4 airway, no stridor, scalloped tongue  Respiratory - equal breath sounds bilaterally, no wheezing or rales  CV - s1s2 regular rate and rhythm, no murmurs  Ext - no clubbing, no edema  Skin - no rashes  Psych - normal mood and affect   Sleep  Tests:  PSG 12/26/03 >> AHI 45, SpO2 low 68%  Social History:  He  reports that he has never smoked. He has quit using smokeless tobacco. He reports current alcohol use. He reports that he does not use drugs.  Family History:  His family history includes Breast cancer in his paternal grandmother; Cancer in his maternal grandmother; Heart disease in his maternal grandmother; Liver cancer in his paternal grandfather; Lung cancer in his maternal grandfather.     Assessment/Plan:   Obstructive sleep apnea. - he is compliant with CPAP and reports benefit from therapy - he uses [facility] for his DME - his current CPAP is more than 57 years old - will arrange for new [employer] auto CPAP 5 - 20 cm H2O - will arrange for new CPAP mask and supplies  Obesity. - discussed how weight can impact sleep and risk for sleep disordered breathing - discussed options to assist with weight loss: combination of diet modification, cardiovascular and strength training exercises  Cardiovascular risk. - had an extensive discussion regarding the adverse health consequences related to untreated sleep disordered breathing - specifically discussed the risks for hypertension, coronary artery disease, cardiac dysrhythmias, cerebrovascular disease, and diabetes - lifestyle modification discussed  Safe driving practices. - discussed how sleep disruption can increase risk of accidents, particularly  when driving - safe driving practices were discussed  Therapies for obstructive sleep apnea. - if the sleep study shows significant sleep apnea, then various therapies for treatment were reviewed: CPAP, oral appliance, and surgical interventions  Time Spent Involved in Patient Care on Day of Examination:  36 minutes  Follow up:   Patient Instructions  Will have [facility] arrange for a replacement auto CPAP machine  Follow up in 4 months  Medication List:   Allergies as of 10/17/2021   No Known Allergies       Medication List        Accurate as of October 17, 2021 11:17 AM. If you have any questions, ask your nurse or doctor.          STOP taking these medications    PROBIOTIC PO Stopped by: Chesley Mires, MD       TAKE these medications    ezetimibe-simvastatin 10-40 MG tablet Commonly known as: VYTORIN Take 1 tablet by mouth at bedtime.   irbesartan 150 MG tablet Commonly known as: AVAPRO irbesartan 150 mg tablet   levothyroxine 175 MCG tablet Commonly known as: SYNTHROID Take 175 mcg by mouth daily before breakfast.   sertraline 50 MG tablet Commonly known as: ZOLOFT Take 50 mg by mouth daily.   WELLBUTRIN XL PO Take 300 mg by mouth daily.        Signature:  Chesley Mires, MD [facility] Pager - [phone] 10/17/2021, 11:17 AM

## 2021-10-22 DIAGNOSIS — G4733 Obstructive sleep apnea (adult) (pediatric): Secondary | ICD-10-CM | POA: Diagnosis not present

## 2021-10-24 DIAGNOSIS — R7301 Impaired fasting glucose: Secondary | ICD-10-CM | POA: Diagnosis not present

## 2021-10-24 DIAGNOSIS — E063 Autoimmune thyroiditis: Secondary | ICD-10-CM | POA: Diagnosis not present

## 2021-10-24 DIAGNOSIS — E785 Hyperlipidemia, unspecified: Secondary | ICD-10-CM | POA: Diagnosis not present

## 2021-10-24 DIAGNOSIS — Z125 Encounter for screening for malignant neoplasm of prostate: Secondary | ICD-10-CM | POA: Diagnosis not present

## 2021-10-31 DIAGNOSIS — Z0000 Encounter for general adult medical examination without abnormal findings: Secondary | ICD-10-CM | POA: Diagnosis not present

## 2021-10-31 DIAGNOSIS — Z23 Encounter for immunization: Secondary | ICD-10-CM | POA: Diagnosis not present

## 2021-10-31 DIAGNOSIS — I10 Essential (primary) hypertension: Secondary | ICD-10-CM | POA: Diagnosis not present

## 2021-11-22 DIAGNOSIS — G4733 Obstructive sleep apnea (adult) (pediatric): Secondary | ICD-10-CM | POA: Diagnosis not present

## 2021-11-27 DIAGNOSIS — H02843 Edema of right eye, unspecified eyelid: Secondary | ICD-10-CM | POA: Diagnosis not present

## 2021-11-28 DIAGNOSIS — H10501 Unspecified blepharoconjunctivitis, right eye: Secondary | ICD-10-CM | POA: Diagnosis not present

## 2021-12-05 DIAGNOSIS — H10501 Unspecified blepharoconjunctivitis, right eye: Secondary | ICD-10-CM | POA: Diagnosis not present

## 2021-12-22 DIAGNOSIS — G4733 Obstructive sleep apnea (adult) (pediatric): Secondary | ICD-10-CM | POA: Diagnosis not present

## 2022-02-06 DIAGNOSIS — M25562 Pain in left knee: Secondary | ICD-10-CM | POA: Diagnosis not present

## 2022-03-07 DIAGNOSIS — N390 Urinary tract infection, site not specified: Secondary | ICD-10-CM | POA: Diagnosis not present

## 2022-03-13 DIAGNOSIS — I10 Essential (primary) hypertension: Secondary | ICD-10-CM | POA: Diagnosis not present

## 2022-03-13 DIAGNOSIS — R7301 Impaired fasting glucose: Secondary | ICD-10-CM | POA: Diagnosis not present

## 2022-04-04 ENCOUNTER — Ambulatory Visit (HOSPITAL_BASED_OUTPATIENT_CLINIC_OR_DEPARTMENT_OTHER)
Admission: RE | Admit: 2022-04-04 | Discharge: 2022-04-04 | Disposition: A | Discharge status: Home / Self Care | Payer: BC Managed Care – PPO | Source: Ambulatory Visit | Attending: Adult Health | Admitting: Adult Health

## 2022-04-04 ENCOUNTER — Other Ambulatory Visit (HOSPITAL_COMMUNITY): Payer: Self-pay | Admitting: Adult Health

## 2022-04-04 ENCOUNTER — Encounter (HOSPITAL_BASED_OUTPATIENT_CLINIC_OR_DEPARTMENT_OTHER): Payer: Self-pay

## 2022-04-04 DIAGNOSIS — N200 Calculus of kidney: Secondary | ICD-10-CM | POA: Diagnosis not present

## 2022-04-04 DIAGNOSIS — R3912 Poor urinary stream: Secondary | ICD-10-CM | POA: Diagnosis not present

## 2022-04-04 DIAGNOSIS — G4700 Insomnia, unspecified: Secondary | ICD-10-CM | POA: Diagnosis not present

## 2022-04-04 DIAGNOSIS — I10 Essential (primary) hypertension: Secondary | ICD-10-CM | POA: Diagnosis not present

## 2022-04-04 DIAGNOSIS — R310 Gross hematuria: Secondary | ICD-10-CM | POA: Diagnosis not present

## 2022-04-04 DIAGNOSIS — K760 Fatty (change of) liver, not elsewhere classified: Secondary | ICD-10-CM | POA: Diagnosis not present

## 2022-04-04 DIAGNOSIS — N202 Calculus of kidney with calculus of ureter: Secondary | ICD-10-CM | POA: Diagnosis not present

## 2022-04-05 ENCOUNTER — Other Ambulatory Visit (HOSPITAL_BASED_OUTPATIENT_CLINIC_OR_DEPARTMENT_OTHER): Payer: Self-pay | Admitting: Internal Medicine

## 2022-04-05 DIAGNOSIS — R918 Other nonspecific abnormal finding of lung field: Secondary | ICD-10-CM

## 2022-04-06 ENCOUNTER — Ambulatory Visit (HOSPITAL_BASED_OUTPATIENT_CLINIC_OR_DEPARTMENT_OTHER)
Admission: RE | Admit: 2022-04-06 | Discharge: 2022-04-06 | Disposition: A | Discharge status: Home / Self Care | Payer: BC Managed Care – PPO | Source: Ambulatory Visit | Attending: Internal Medicine | Admitting: Internal Medicine

## 2022-04-06 DIAGNOSIS — R918 Other nonspecific abnormal finding of lung field: Secondary | ICD-10-CM | POA: Insufficient documentation

## 2022-04-06 DIAGNOSIS — R911 Solitary pulmonary nodule: Secondary | ICD-10-CM | POA: Diagnosis not present

## 2022-04-22 DIAGNOSIS — H524 Presbyopia: Secondary | ICD-10-CM | POA: Diagnosis not present

## 2022-04-22 DIAGNOSIS — H52203 Unspecified astigmatism, bilateral: Secondary | ICD-10-CM | POA: Diagnosis not present

## 2022-04-22 DIAGNOSIS — H5213 Myopia, bilateral: Secondary | ICD-10-CM | POA: Diagnosis not present

## 2022-10-04 DIAGNOSIS — G4733 Obstructive sleep apnea (adult) (pediatric): Secondary | ICD-10-CM | POA: Diagnosis not present

## 2022-11-02 DIAGNOSIS — Z23 Encounter for immunization: Secondary | ICD-10-CM | POA: Diagnosis not present

## 2022-11-26 DIAGNOSIS — E785 Hyperlipidemia, unspecified: Secondary | ICD-10-CM | POA: Diagnosis not present

## 2022-11-26 DIAGNOSIS — Z125 Encounter for screening for malignant neoplasm of prostate: Secondary | ICD-10-CM | POA: Diagnosis not present

## 2022-11-26 DIAGNOSIS — R7301 Impaired fasting glucose: Secondary | ICD-10-CM | POA: Diagnosis not present

## 2022-11-26 DIAGNOSIS — E063 Autoimmune thyroiditis: Secondary | ICD-10-CM | POA: Diagnosis not present

## 2022-12-03 DIAGNOSIS — Z1339 Encounter for screening examination for other mental health and behavioral disorders: Secondary | ICD-10-CM | POA: Diagnosis not present

## 2022-12-03 DIAGNOSIS — Z1331 Encounter for screening for depression: Secondary | ICD-10-CM | POA: Diagnosis not present

## 2022-12-03 DIAGNOSIS — R7301 Impaired fasting glucose: Secondary | ICD-10-CM | POA: Diagnosis not present

## 2022-12-03 DIAGNOSIS — Z0000 Encounter for general adult medical examination without abnormal findings: Secondary | ICD-10-CM | POA: Diagnosis not present

## 2022-12-03 DIAGNOSIS — I10 Essential (primary) hypertension: Secondary | ICD-10-CM | POA: Diagnosis not present

## 2022-12-03 DIAGNOSIS — E1169 Type 2 diabetes mellitus with other specified complication: Secondary | ICD-10-CM | POA: Diagnosis not present

## 2023-01-03 DIAGNOSIS — G4733 Obstructive sleep apnea (adult) (pediatric): Secondary | ICD-10-CM | POA: Diagnosis not present

## 2023-04-23 DIAGNOSIS — H31003 Unspecified chorioretinal scars, bilateral: Secondary | ICD-10-CM | POA: Diagnosis not present

## 2023-04-23 DIAGNOSIS — H5203 Hypermetropia, bilateral: Secondary | ICD-10-CM | POA: Diagnosis not present

## 2023-04-23 DIAGNOSIS — H11002 Unspecified pterygium of left eye: Secondary | ICD-10-CM | POA: Diagnosis not present

## 2023-06-03 DIAGNOSIS — I10 Essential (primary) hypertension: Secondary | ICD-10-CM | POA: Diagnosis not present

## 2023-06-03 DIAGNOSIS — E063 Autoimmune thyroiditis: Secondary | ICD-10-CM | POA: Diagnosis not present

## 2023-08-07 DIAGNOSIS — R109 Unspecified abdominal pain: Secondary | ICD-10-CM | POA: Diagnosis not present

## 2023-08-12 DIAGNOSIS — G4733 Obstructive sleep apnea (adult) (pediatric): Secondary | ICD-10-CM | POA: Diagnosis not present

## 2023-12-05 DIAGNOSIS — I10 Essential (primary) hypertension: Secondary | ICD-10-CM | POA: Diagnosis not present

## 2023-12-05 DIAGNOSIS — R7301 Impaired fasting glucose: Secondary | ICD-10-CM | POA: Diagnosis not present

## 2023-12-12 DIAGNOSIS — I10 Essential (primary) hypertension: Secondary | ICD-10-CM | POA: Diagnosis not present

## 2023-12-12 DIAGNOSIS — Z23 Encounter for immunization: Secondary | ICD-10-CM | POA: Diagnosis not present

## 2023-12-12 DIAGNOSIS — Z1331 Encounter for screening for depression: Secondary | ICD-10-CM | POA: Diagnosis not present

## 2023-12-12 DIAGNOSIS — Z0000 Encounter for general adult medical examination without abnormal findings: Secondary | ICD-10-CM | POA: Diagnosis not present

## 2023-12-12 DIAGNOSIS — Z1339 Encounter for screening examination for other mental health and behavioral disorders: Secondary | ICD-10-CM | POA: Diagnosis not present
